# Patient Record
Sex: Female | Born: 1980
Health system: Southern US, Community
[De-identification: ages and names within clinical notes are randomized; demographics above are authoritative.]

## PROBLEM LIST (undated history)

## (undated) DIAGNOSIS — T7840XA Allergy, unspecified, initial encounter: Secondary | ICD-10-CM

## (undated) DIAGNOSIS — F329 Major depressive disorder, single episode, unspecified: Secondary | ICD-10-CM

## (undated) DIAGNOSIS — F32A Depression, unspecified: Secondary | ICD-10-CM

## (undated) DIAGNOSIS — F419 Anxiety disorder, unspecified: Secondary | ICD-10-CM

## (undated) HISTORY — DX: Allergy, unspecified, initial encounter: T78.40XA

## (undated) HISTORY — DX: Anxiety disorder, unspecified: F41.9

## (undated) HISTORY — DX: Depression, unspecified: F32.A

## (undated) HISTORY — DX: Major depressive disorder, single episode, unspecified: F32.9

## (undated) SURGERY — Surgical Case
Anesthesia: Regional

---
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
## 2000-05-27 HISTORY — PX: TOOTH EXTRACTION: SUR596

## 2012-12-31 ENCOUNTER — Telehealth (HOSPITAL_COMMUNITY): Payer: Self-pay | Admitting: Licensed Clinical Social Worker

## 2012-12-31 ENCOUNTER — Emergency Department (HOSPITAL_COMMUNITY)
Admission: EM | Admit: 2012-12-31 | Discharge: 2012-12-31 | Disposition: A | Discharge status: Home / Self Care | Payer: 59 | Attending: Emergency Medicine | Admitting: Emergency Medicine

## 2012-12-31 ENCOUNTER — Encounter (HOSPITAL_COMMUNITY): Payer: Self-pay | Admitting: *Deleted

## 2012-12-31 DIAGNOSIS — Z3202 Encounter for pregnancy test, result negative: Secondary | ICD-10-CM | POA: Insufficient documentation

## 2012-12-31 DIAGNOSIS — Z711 Person with feared health complaint in whom no diagnosis is made: Secondary | ICD-10-CM | POA: Insufficient documentation

## 2012-12-31 DIAGNOSIS — Z0000 Encounter for general adult medical examination without abnormal findings: Secondary | ICD-10-CM

## 2012-12-31 LAB — CBC WITH DIFFERENTIAL/PLATELET
Basophils Absolute: 0 10*3/uL (ref 0.0–0.1)
Basophils Relative: 0 % (ref 0–1)
Eosinophils Relative: 1 % (ref 0–5)
HCT: 35.2 % — ABNORMAL LOW (ref 36.0–46.0)
MCHC: 33.2 g/dL (ref 30.0–36.0)
MCV: 81.1 fL (ref 78.0–100.0)
Monocytes Absolute: 0.4 10*3/uL (ref 0.1–1.0)
Neutro Abs: 2.1 10*3/uL (ref 1.7–7.7)
Platelets: 200 10*3/uL (ref 150–400)
RDW: 13.2 % (ref 11.5–15.5)
WBC: 4.3 10*3/uL (ref 4.0–10.5)

## 2012-12-31 LAB — RAPID URINE DRUG SCREEN, HOSP PERFORMED
Amphetamines: NOT DETECTED
Barbiturates: NOT DETECTED
Benzodiazepines: NOT DETECTED
Cocaine: NOT DETECTED

## 2012-12-31 LAB — URINALYSIS, ROUTINE W REFLEX MICROSCOPIC
Bilirubin Urine: NEGATIVE
Ketones, ur: NEGATIVE mg/dL
Leukocytes, UA: NEGATIVE
Nitrite: NEGATIVE
Protein, ur: NEGATIVE mg/dL
Urobilinogen, UA: 0.2 mg/dL (ref 0.0–1.0)

## 2012-12-31 LAB — BASIC METABOLIC PANEL
Calcium: 9.7 mg/dL (ref 8.4–10.5)
Creatinine, Ser: 0.88 mg/dL (ref 0.50–1.10)
GFR calc Af Amer: >90 mL/min (ref >90)
Sodium: 139 mEq/L (ref 135–145)

## 2012-12-31 LAB — ETHANOL: Alcohol, Ethyl (B): <11 mg/dL (ref 0–11)
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
## 2012-12-31 NOTE — ED Notes (Signed)
MD at bedside. 

## 2012-12-31 NOTE — BH Assessment (Signed)
Assessment Note  Cassidy Short is an 32 y.o. female with no history of mental illness. She presents to [facility] transported via deputy. Pt also under IVC and papers were initiated by her spouse. IVC papers state that patient threatened suicide today and has a plan that her spouse  Is afraid she will go through with. He claims that patient has access to medications at her job. 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 IVC also sts that patient has cut herself. Pt admits that today she had a verbal argument with her spouse. Says that they have had conflict with each other x1 yr. Pt and spouse have tried marital counseling 1x over a yr ago but did not continue any further sessions. Today patient denies SI and contracts for safety. She denies HI and AVH's. She has not history of prior suicide attempts/gestures. No history of harm to others or AVH's. She has a great support system, which is her best friend at bedside. She also sts that she has a lot of other friends and family that are also supportive. Patient is open to individual therapy if needed. Pt denies depression/anxiety or history. Her affect and mood is appropriate. She reports stress related to marital issues only and not other issues noted. She is cooperative, alert, pleasant mood. Her judgement and insight appear normal. Pt's appetite/sleep are also normal. She denies drug use (UDS is negative). She drinks alcoholic beverages socially.  Pt ran by Verne Spurr, PA for dis positioning. Lloyd Huger consulted with Dr. Lucianne Muss who requested Lloyd Huger to complete a tele psych. Lloyd Huger will evaluate patient for further dis positoning.    Axis I: Mood Disorder NOS Axis II: Deferred Axis III: No past medical history on file. Axis IV: Marital Conflict Axis V: 51-60 moderate symptoms  Past Medical History: No past medical history on file.  Past Surgical History  Procedure Laterality Date  . Hernia repair      Family History: No family history on  file.  Social History:  reports that she has never smoked. She does not have any smokeless tobacco history on file. She reports that  drinks alcohol. She reports that she does not use illicit drugs.  Additional Social History:  Alcohol / Drug Use Pain Medications: SEE MAR Prescriptions: SEE MAR Over the Counter: SEE MAR History of alcohol / drug use?: Yes Substance #1 Name of Substance 1: Alcohol  1 - Age of First Use: teens 1 - Amount (size/oz): n/a 1 - Frequency: social drinker only  1 - Duration: n/a 1 - Last Use / Amount: "2-3 weekends ago"  CIWA:   COWS:    Allergies: No Known Allergies  Home Medications:  (Not in a hospital admission)  OB/GYN Status:  Patient's last menstrual period was 12/08/2012.  General Assessment Data Location of Assessment: [facility] ED Is this a Tele or Face-to-Face Assessment?: Tele Assessment Is this an Initial Assessment or a Re-assessment for this encounter?: Initial Assessment Living Arrangements: Other (Comment);Spouse/significant other (pt lives with her spouse) Can pt return to current living arrangement?: Yes Admission Status: Voluntary Is patient capable of signing voluntary admission?: Yes Transfer from: Acute Hospital Referral Source: Self/Family/Friend     Risk to self Suicidal Ideation: No (pt denies; however; spouse sts pt is suicidal on IVC papers) Suicidal Intent: No Is patient at risk for suicide?: No Suicidal Plan?: No Access to Means: No What has been your use of drugs/alcohol within the last 12 months?:  (pt denies alcohol and drug abuse;  social drinker only) Previous Attempts/Gestures:  (pt denies; spouse reports pt cut herself on IVC papers) How many times?:  (0 per patient) Other Self Harm Risks:  (pt reports pt cut herself; pt denies) Triggers for Past Attempts: Other (Comment) (no previous attempts/gestures; per patient) Intentional Self Injurious Behavior: None Family Suicide History:  ("My sister has mental illness  but not sure with what") Recent stressful life event(s): Other (Comment);Turmoil (Comment);Conflict (Comment) (marital problems; "big argument with spouse today") Persecutory voices/beliefs?: No Depression: Yes Depression Symptoms: Feeling angry/irritable;Feeling worthless/self pity;Loss of interest in usual pleasures;Isolating Substance abuse history and/or treatment for substance abuse?: No Suicide prevention information given to non-admitted patients: Not applicable  Risk to Others Homicidal Ideation: No Thoughts of Harm to Others: No Current Homicidal Intent: No Current Homicidal Plan: No Access to Homicidal Means: No Identified Victim:  (n/a) History of harm to others?: No Assessment of Violence: None Noted Violent Behavior Description:  (patient calm and cooperative) Does patient have access to weapons?: No Criminal Charges Pending?: No Does patient have a court date: No  Psychosis Hallucinations: None noted Delusions: None noted  Mental Status Report Appear/Hygiene: Other (Comment) (appropriate) Eye Contact: Fair Motor Activity: Freedom of movement Speech: Logical/coherent Level of Consciousness: Alert Mood: Depressed Affect: Appropriate to circumstance Anxiety Level: None Thought Processes: Coherent Judgement: Unimpaired Orientation: Person;Place;Time;Situation Obsessive Compulsive Thoughts/Behaviors: None  Cognitive Functioning Concentration: Decreased Memory: Remote Intact;Recent Intact IQ: Average Insight: Good Impulse Control: Good Appetite: Good Weight Loss:  (none reported) Weight Gain:  (none reported) Sleep: No Change Total Hours of Sleep:  (6-8 hours per night) Vegetative Symptoms: None  ADLScreening [facility] Assessment Services) Patient's cognitive ability adequate to safely complete daily activities?: Yes Patient able to express need for assistance with ADLs?: Yes Independently performs ADLs?: Yes (appropriate for developmental age)  Prior  Inpatient Therapy Prior Inpatient Therapy: No Prior Therapy Dates:  (n/a) Prior Therapy Facilty/Provider(s):  (n/a) Reason for Treatment:  (n/a)  Prior Outpatient Therapy Prior Outpatient Therapy: Yes Prior Therapy Dates:  (over 1 yr ago) Prior Therapy Facilty/Provider(s):  (Marital Counselor "Linda") Reason for Treatment:  (depression)  ADL Screening (condition at time of admission) Patient's cognitive ability adequate to safely complete daily activities?: Yes Is the patient deaf or have difficulty hearing?: No Does the patient have difficulty seeing, even when wearing glasses/contacts?: No Does the patient have difficulty concentrating, remembering, or making decisions?: No Patient able to express need for assistance with ADLs?: Yes Does the patient have difficulty dressing or bathing?: No Independently performs ADLs?: Yes (appropriate for developmental age) Communication: Independent Dressing (OT): Independent Grooming: Independent Feeding: Independent Bathing: Independent Toileting: Independent In/Out Bed: Independent Walks in Home: Independent Does the patient have difficulty walking or climbing stairs?: No Weakness of Legs: None Weakness of Arms/Hands: None  Home Assistive Devices/Equipment Home Assistive Devices/Equipment: None    Abuse/Neglect Assessment (Assessment to be complete while patient is alone) Physical Abuse: Denies Verbal Abuse: Denies Sexual Abuse: Denies Exploitation of patient/patient's resources: Denies Self-Neglect: Denies Values / Beliefs Cultural Requests During Hospitalization: None Spiritual Requests During Hospitalization: None   Advance Directives (For Healthcare) Advance Directive: Patient does not have advance directive Nutrition Screen- [facility] Adult/[facility] Patient's home diet: Regular  Additional Information 1:1 In Past 12 Months?: Yes CIRT Risk: No Elopement Risk: No Does patient have medical clearance?: Yes     Disposition:   Disposition Initial Assessment Completed for this Encounter: Yes Disposition of Patient: Other dispositions (Pending evaluation by provider Antoine Poche, PA)) Other disposition(s):  (Dispositoin pending a  telepsych consult)  On Site Evaluation by:   Reviewed with Physician:    Melynda Ripple [facility] 12/31/2012 2:22 PM

## 2012-12-31 NOTE — ED Notes (Signed)
Pt changing into paper scrubs, security called to wand pt, [employer] at bedside
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
## 2013-11-08 DIAGNOSIS — T7840XA Allergy, unspecified, initial encounter: Secondary | ICD-10-CM | POA: Insufficient documentation

## 2013-11-08 DIAGNOSIS — F419 Anxiety disorder, unspecified: Secondary | ICD-10-CM | POA: Insufficient documentation

## 2013-11-08 DIAGNOSIS — F325 Major depressive disorder, single episode, in full remission: Secondary | ICD-10-CM | POA: Insufficient documentation

## 2013-11-09 ENCOUNTER — Encounter: Payer: Self-pay | Admitting: Physician Assistant

## 2013-11-09 ENCOUNTER — Ambulatory Visit (INDEPENDENT_AMBULATORY_CARE_PROVIDER_SITE_OTHER): Payer: 59 | Admitting: Physician Assistant

## 2013-11-09 VITALS — BP 110/62 | HR 72 | Temp 98.5°F | Resp 16 | Ht 64.0 in | Wt 143.0 lb

## 2013-11-09 DIAGNOSIS — F329 Major depressive disorder, single episode, unspecified: Secondary | ICD-10-CM

## 2013-11-09 DIAGNOSIS — Z0000 Encounter for general adult medical examination without abnormal findings: Secondary | ICD-10-CM

## 2013-11-09 DIAGNOSIS — F32A Depression, unspecified: Secondary | ICD-10-CM

## 2013-11-09 DIAGNOSIS — F411 Generalized anxiety disorder: Secondary | ICD-10-CM

## 2013-11-09 LAB — CBC WITH DIFFERENTIAL/PLATELET
BASOS ABS: 0.1 10*3/uL (ref 0.0–0.1)
Basophils Relative: 1 % (ref 0–1)
Eosinophils Absolute: 0.1 10*3/uL (ref 0.0–0.7)
Eosinophils Relative: 1 % (ref 0–5)
HEMATOCRIT: 34.7 % — AB (ref 36.0–46.0)
HEMOGLOBIN: 11.9 g/dL — AB (ref 12.0–15.0)
LYMPHS PCT: 31 % (ref 12–46)
Lymphs Abs: 1.7 10*3/uL (ref 0.7–4.0)
MCH: 26.4 pg (ref 26.0–34.0)
MCHC: 34.3 g/dL (ref 30.0–36.0)
MCV: 77.1 fL — ABNORMAL LOW (ref 78.0–100.0)
MONO ABS: 0.3 10*3/uL (ref 0.1–1.0)
MONOS PCT: 6 % (ref 3–12)
NEUTROS ABS: 3.3 10*3/uL (ref 1.7–7.7)
NEUTROS PCT: 61 % (ref 43–77)
Platelets: 225 10*3/uL (ref 150–400)
RBC: 4.5 MIL/uL (ref 3.87–5.11)
RDW: 15.2 % (ref 11.5–15.5)
WBC: 5.4 10*3/uL (ref 4.0–10.5)

## 2013-11-09 MED ORDER — ALPRAZOLAM 0.5 MG PO TABS: ORAL_TABLET | ORAL | Status: DC

## 2013-11-09 MED ORDER — DROSPIRENONE-ETHINYL ESTRADIOL 3-0.02 MG PO TABS: 1.0000 | ORAL_TABLET | Freq: Every day | ORAL | Status: DC

## 2013-11-09 MED ORDER — BUPROPION HCL ER (XL) 150 MG PO TB24: 150.0000 mg | ORAL_TABLET | ORAL | Status: DC
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
## 2013-11-09 NOTE — Patient Instructions (Signed)

## 2013-11-10 LAB — LIPID PANEL
Cholesterol: 138 mg/dL (ref 0–200)
HDL: 59 mg/dL (ref >39)
LDL CALC: 50 mg/dL (ref 0–99)
TRIGLYCERIDES: 147 mg/dL (ref <150)
Total CHOL/HDL Ratio: 2.3 Ratio
VLDL: 29 mg/dL (ref 0–40)

## 2013-11-10 LAB — URINALYSIS, ROUTINE W REFLEX MICROSCOPIC
BILIRUBIN URINE: NEGATIVE
GLUCOSE, UA: NEGATIVE mg/dL
Hgb urine dipstick: NEGATIVE
KETONES UR: NEGATIVE mg/dL
Leukocytes, UA: NEGATIVE
Nitrite: NEGATIVE
PH: 5.5 (ref 5.0–8.0)
Protein, ur: NEGATIVE mg/dL
SPECIFIC GRAVITY, URINE: 1.029 (ref 1.005–1.030)
UROBILINOGEN UA: 0.2 mg/dL (ref 0.0–1.0)

## 2013-11-10 LAB — BASIC METABOLIC PANEL WITH GFR
BUN: 16 mg/dL (ref 6–23)
CHLORIDE: 103 meq/L (ref 96–112)
CO2: 27 mEq/L (ref 19–32)
Calcium: 9.3 mg/dL (ref 8.4–10.5)
Creat: 0.81 mg/dL (ref 0.50–1.10)
GFR, Est African American: >89 mL/min
GLUCOSE: 80 mg/dL (ref 70–99)
POTASSIUM: 4.1 meq/L (ref 3.5–5.3)
SODIUM: 138 meq/L (ref 135–145)

## 2013-11-10 LAB — MAGNESIUM: MAGNESIUM: 1.9 mg/dL (ref 1.5–2.5)

## 2013-11-10 LAB — HEPATIC FUNCTION PANEL
ALK PHOS: 40 U/L (ref 39–117)
ALT: 12 U/L (ref 0–35)
AST: 18 U/L (ref 0–37)
Albumin: 4.3 g/dL (ref 3.5–5.2)
BILIRUBIN DIRECT: 0.1 mg/dL (ref 0.0–0.3)
BILIRUBIN INDIRECT: 0.3 mg/dL (ref 0.2–1.2)
TOTAL PROTEIN: 6.6 g/dL (ref 6.0–8.3)
Total Bilirubin: 0.4 mg/dL (ref 0.2–1.2)

## 2013-11-10 LAB — IRON AND TIBC
%SAT: 7 % — AB (ref 20–55)
IRON: 30 ug/dL — AB (ref 42–145)
TIBC: 409 ug/dL (ref 250–470)
UIBC: 379 ug/dL (ref 125–400)

## 2013-11-10 LAB — FERRITIN: Ferritin: 6 ng/mL — ABNORMAL LOW (ref 10–291)

## 2013-11-10 LAB — INSULIN, FASTING: INSULIN FASTING, SERUM: 39 u[IU]/mL — AB (ref 3–28)

## 2013-11-10 LAB — HEMOGLOBIN A1C
Hgb A1c MFr Bld: 5.7 % — ABNORMAL HIGH (ref <5.7)
Mean Plasma Glucose: 117 mg/dL — ABNORMAL HIGH (ref <117)

## 2013-11-10 LAB — MICROALBUMIN / CREATININE URINE RATIO
CREATININE, URINE: 205.2 mg/dL
MICROALB UR: 0.75 mg/dL (ref 0.00–1.89)
MICROALB/CREAT RATIO: 3.7 mg/g (ref 0.0–30.0)

## 2013-11-10 LAB — VITAMIN D 25 HYDROXY (VIT D DEFICIENCY, FRACTURES): Vit D, 25-Hydroxy: 36 ng/mL (ref 30–89)

## 2013-11-10 LAB — TSH: TSH: 3.015 u[IU]/mL (ref 0.350–4.500)

## 2013-11-10 LAB — VITAMIN B12: VITAMIN B 12: 326 pg/mL (ref 211–911)

## 2013-12-15 ENCOUNTER — Other Ambulatory Visit (HOSPITAL_COMMUNITY)
Admission: RE | Admit: 2013-12-15 | Discharge: 2013-12-15 | Disposition: A | Discharge status: Home / Self Care | Payer: 59 | Source: Ambulatory Visit | Attending: Physician Assistant | Admitting: Physician Assistant

## 2013-12-15 ENCOUNTER — Encounter: Payer: Self-pay | Admitting: Physician Assistant

## 2013-12-15 ENCOUNTER — Ambulatory Visit (INDEPENDENT_AMBULATORY_CARE_PROVIDER_SITE_OTHER): Payer: 59 | Admitting: Physician Assistant

## 2013-12-15 VITALS — BP 100/60 | HR 60 | Temp 97.9°F | Resp 16 | Wt 139.0 lb

## 2013-12-15 DIAGNOSIS — F411 Generalized anxiety disorder: Secondary | ICD-10-CM

## 2013-12-15 DIAGNOSIS — F3289 Other specified depressive episodes: Secondary | ICD-10-CM

## 2013-12-15 DIAGNOSIS — F329 Major depressive disorder, single episode, unspecified: Secondary | ICD-10-CM

## 2013-12-15 DIAGNOSIS — F32A Depression, unspecified: Secondary | ICD-10-CM

## 2013-12-15 DIAGNOSIS — Z124 Encounter for screening for malignant neoplasm of cervix: Secondary | ICD-10-CM

## 2013-12-15 DIAGNOSIS — Z1151 Encounter for screening for human papillomavirus (HPV): Secondary | ICD-10-CM | POA: Insufficient documentation

## 2013-12-15 DIAGNOSIS — Z01419 Encounter for gynecological examination (general) (routine) without abnormal findings: Secondary | ICD-10-CM | POA: Insufficient documentation

## 2013-12-15 MED ORDER — LEVONORGEST-ETH ESTRAD 91-DAY 0.15-0.03 MG PO TABS: 1.0000 | ORAL_TABLET | Freq: Every day | ORAL | Status: DC

## 2013-12-15 NOTE — Progress Notes (Signed)
   Subjective:    Patient ID: Cassidy Short, female    DOB: 11/16/1980, 33 y.o.   MRN: [id number]  HPI Patient presents for 1 month follow up for PAP. She was also put on BCP, yaz, but she has been having spotting and is spotting today. She was put on wellbutrin and states that she has less "down days" and she would like to continue it. She has not tried the xanax yet but has it if needed.   Review of Systems  All other systems reviewed and are negative.      Objective:   Physical Exam  Constitutional: She is oriented to person, place, and time.  Cardiovascular: Normal rate and regular rhythm.   Pulmonary/Chest: Effort normal and breath sounds normal.  Abdominal: Soft. Bowel sounds are normal.  Genitourinary: Vagina normal and uterus normal. There is no rash or lesion on the right labia. There is no rash or lesion on the left labia. Cervix exhibits discharge (some minimal blood). Cervix exhibits no motion tenderness and no friability. Right adnexum displays no mass. Left adnexum displays no mass. No erythema or tenderness around the vagina. No vaginal discharge found.  Pap sent off  Neurological: She is alert and oriented to person, place, and time.  Skin: Skin is warm and dry.      Assessment & Plan:  BCP- would like to try a 3 month pill, will prescribe.  Anxiety/depression- continue wellbutrin XL Pap sent off

## 2013-12-15 NOTE — Patient Instructions (Signed)
B12 is low end of normal, add sublingual B12. The sublingual is better than the pills because likely you are not absorbing in your intestines well so the sublingual gets absorbed through your mouth and ensures you get the amount you need. Will help with energy, memory/concentration, decrease nerve pain, and help with weight loss. B12 is water soluble vitamin so you can not over dose on it, and anything you do not use will be sent out in your urine.    Contraception Choices Birth control (contraception) is the use of any methods or devices to stop pregnancy from happening. Below are some methods to help avoid pregnancy. HORMONAL BIRTH CONTROL  A small tube put under the skin of the upper arm (implant). The tube can stay in place for 3 years. The implant must be taken out after 3 years.  Shots given every 3 months.  Pills taken every day.  Patches that are changed once a week.  A ring put into the vagina (vaginal ring). The ring is left in place for 3 weeks and removed for 1 week. Then, a new ring is put in the vagina.  Emergency birth control pills taken after unprotected sex (intercourse). BARRIER BIRTH CONTROL   A thin covering worn on the penis (female condom) during sex.  A soft, loose covering put into the vagina (female condom) before sex.  A rubber bowl that sits over the cervix (diaphragm). The bowl must be made for you. The bowl is put into the vagina before sex. The bowl is left in place for 6 to 8 hours after sex.  A small, soft cup that fits over the cervix (cervical cap). The cup must be made for you. The cup can be left in place for 48 hours after sex.  A sponge that is put into the vagina before sex.  A chemical that kills or stops sperm from getting into the cervix and uterus (spermicide). The chemical may be a cream, jelly, foam, or pill. INTRAUTERINE (IUD) BIRTH CONTROL   IUD birth control is a small, T-shaped piece of plastic. The plastic is put inside the uterus.  There are 2 types of IUD:  Copper IUD. The IUD is covered in copper wire. The copper makes a fluid that kills sperm. It can stay in place for 10 years.  Hormone IUD. The hormone stops pregnancy from happening. It can stay in place for 5 years. PERMANENT METHODS  When the woman has her fallopian tubes sealed, tied, or blocked during surgery. This stops the egg from traveling to the uterus.  The doctor places a small coil or insert into each fallopian tube. This causes scar tissue to form and blocks the fallopian tubes.  When the female has the tubes that carry sperm tied off (vasectomy). NATURAL FAMILY PLANNING BIRTH CONTROL   Natural family planning means not having sex or using barrier birth control on the days the woman could become pregnant.  Use a calendar to keep track of the length of each period and know the days she can get pregnant.  Avoid sex during ovulation.  Use a thermometer to measure body temperature. Also watch for symptoms of ovulation.  Time sex to be after the woman has ovulated. Use condoms to help protect yourself against sexually transmitted infections (STIs). Do this no matter what type of birth control you use. Talk to your doctor about which type of birth control is best for you. Document Released: 03/10/2009 Document Revised: 05/18/2013 Document Reviewed: 12/02/2012 [employer] Patient  Information 2015 [employer]. This information is not intended to replace advice given to you by your health care provider. Make sure you discuss any questions you have with your health care provider.  

## 2013-12-17 LAB — CYTOLOGY - PAP

## 2013-12-28 ENCOUNTER — Telehealth: Payer: Self-pay | Admitting: Internal Medicine

## 2013-12-28 NOTE — Telephone Encounter (Signed)
PATIENT CALLED AND INFORMED US THAT THE PHARM SAID RX FOR  levonorgestrel-ethinyl estradiol (JOLESSA) 0.15-0.03 MG tablet [[id number]][[id number]] HAS TO BE CALLED IN AGAIN.  Thank you, Katrina Webb SilversmithWelch [facility] [phone] Fax [phone]

## 2013-12-29 MED ORDER — LEVONORGEST-ETH ESTRAD 91-DAY 0.15-0.03 MG PO TABS: 1.0000 | ORAL_TABLET | Freq: Every day | ORAL | Status: DC

## 2014-02-10 ENCOUNTER — Other Ambulatory Visit: Payer: Self-pay | Admitting: Physician Assistant

## 2014-05-10 ENCOUNTER — Other Ambulatory Visit: Payer: Self-pay | Admitting: Internal Medicine

## 2014-06-13 ENCOUNTER — Other Ambulatory Visit: Payer: Self-pay | Admitting: Physician Assistant

## 2014-11-10 ENCOUNTER — Encounter: Payer: Self-pay | Admitting: Physician Assistant

## 2014-11-17 ENCOUNTER — Encounter: Payer: Self-pay | Admitting: Physician Assistant

## 2014-11-17 ENCOUNTER — Ambulatory Visit (INDEPENDENT_AMBULATORY_CARE_PROVIDER_SITE_OTHER): Payer: 59 | Admitting: Physician Assistant

## 2014-11-17 VITALS — BP 102/68 | HR 56 | Temp 97.7°F | Resp 16 | Ht 64.0 in | Wt 143.0 lb

## 2014-11-17 DIAGNOSIS — R5383 Other fatigue: Secondary | ICD-10-CM

## 2014-11-17 DIAGNOSIS — R6889 Other general symptoms and signs: Secondary | ICD-10-CM

## 2014-11-17 DIAGNOSIS — T7840XD Allergy, unspecified, subsequent encounter: Secondary | ICD-10-CM

## 2014-11-17 DIAGNOSIS — H00013 Hordeolum externum right eye, unspecified eyelid: Secondary | ICD-10-CM

## 2014-11-17 DIAGNOSIS — F32A Depression, unspecified: Secondary | ICD-10-CM

## 2014-11-17 DIAGNOSIS — F329 Major depressive disorder, single episode, unspecified: Secondary | ICD-10-CM

## 2014-11-17 DIAGNOSIS — Z0001 Encounter for general adult medical examination with abnormal findings: Secondary | ICD-10-CM

## 2014-11-17 DIAGNOSIS — E538 Deficiency of other specified B group vitamins: Secondary | ICD-10-CM | POA: Insufficient documentation

## 2014-11-17 DIAGNOSIS — E559 Vitamin D deficiency, unspecified: Secondary | ICD-10-CM | POA: Insufficient documentation

## 2014-11-17 DIAGNOSIS — F419 Anxiety disorder, unspecified: Secondary | ICD-10-CM

## 2014-11-17 DIAGNOSIS — Z0000 Encounter for general adult medical examination without abnormal findings: Secondary | ICD-10-CM

## 2014-11-17 LAB — CBC WITH DIFFERENTIAL/PLATELET
BASOS ABS: 0 10*3/uL (ref 0.0–0.1)
BASOS PCT: 0 % (ref 0–1)
Eosinophils Absolute: 0.1 10*3/uL (ref 0.0–0.7)
Eosinophils Relative: 2 % (ref 0–5)
HEMATOCRIT: 39.7 % (ref 36.0–46.0)
Hemoglobin: 13.3 g/dL (ref 12.0–15.0)
LYMPHS PCT: 23 % (ref 12–46)
Lymphs Abs: 1.7 10*3/uL (ref 0.7–4.0)
MCH: 27.6 pg (ref 26.0–34.0)
MCHC: 33.5 g/dL (ref 30.0–36.0)
MCV: 82.4 fL (ref 78.0–100.0)
MPV: 8.6 fL (ref 8.6–12.4)
Monocytes Absolute: 0.4 10*3/uL (ref 0.1–1.0)
Monocytes Relative: 5 % (ref 3–12)
NEUTROS ABS: 5.1 10*3/uL (ref 1.7–7.7)
Neutrophils Relative %: 70 % (ref 43–77)
PLATELETS: 211 10*3/uL (ref 150–400)
RBC: 4.82 MIL/uL (ref 3.87–5.11)
RDW: 13.6 % (ref 11.5–15.5)
WBC: 7.3 10*3/uL (ref 4.0–10.5)

## 2014-11-17 MED ORDER — ERYTHROMYCIN 5 MG/GM OP OINT: TOPICAL_OINTMENT | OPHTHALMIC | Status: DC

## 2014-11-17 MED ORDER — DOXYCYCLINE HYCLATE 100 MG PO TABS: 100.0000 mg | ORAL_TABLET | Freq: Two times a day (BID) | ORAL | Status: DC

## 2014-11-17 MED ORDER — PREDNISONE 20 MG PO TABS: ORAL_TABLET | ORAL | Status: DC

## 2014-11-17 NOTE — Progress Notes (Signed)
Complete Physical  Assessment and Plan: 1. Depression remission  2. Anxiety remission  3. Allergy, subsequent encounter Take OTC allergy pill  4. B12 deficiency Check labs, ? Need injection - Vitamin B12 - Methylmalonic acid, serum  5. Vitamin D deficiency - Vit D  25 hydroxy (rtn osteoporosis monitoring)  6. Other fatigue Discussed that we will try to treat other things first, does not need ADD medication if does not have ADD. Increase sleep, eat better, will check labs.  If all negative may need to retry wellbutrin.  - CBC with Differential/Platelet - BASIC METABOLIC PANEL WITH GFR - Hepatic function panel - Lipid panel - TSH - Magnesium - Urinalysis, Routine w reflex microscopic (not at [facility]) - Iron and TIBC - Ferritin  7. Routine general medical examination at a health care facility Pap due 2018, TDAP next year  8. Hordeolum externum, right Get appointment with eye doctor, no evidence of cellulitis at this time.  Go to ER if changes in vision, red/painful eye.  - predniSONE (DELTASONE) 20 MG tablet; 2 tablets daily for 3 days, 1 tablet daily for 4 days.  Dispense: 10 tablet; Refill: 0 - doxycycline (VIBRA-TABS) 100 MG tablet; Take 1 tablet (100 mg total) by mouth 2 (two) times daily.  Dispense: 20 tablet; Refill: 0 - erythromycin ophthalmic ointment; Apply 1 cm ribbon in affected eye(s) up to 6 times daily for 7 days.  Dispense quantity sufficient.  Dispense: 3.5 g; Refill: 0  Discussed med's effects and SE's. Screening labs and tests as requested with regular follow-up as recommended.  HPI 34 y.o. female  presents for a complete physical. Her blood pressure has been controlled at home, today their BP is BP: 102/68 mmHg She does workout and has moved to [place]. She denies chest pain, shortness of breath, dizziness.  She works as a Radiation protection practitioner, her and her husband have recently divorced, she has not seen him since Jan.  She is off wellbutrin and very  rarely takes xanax, she is not seeing a Veterinary surgeon but feels she is doing well.  She has started school and states she has been very tired with school and work, she is requesting an ADD medication to help with fatigue but she has never had a problem in the past with ADD, can complete tasks.  She has had 3 days of right lower lid swelling, has history of stys in the past but this one has gotten infected, + swelling, crusting, painful swollen lower lid without fever, chills, without photosensitivity/blurred vision, HA, nausea.    Current Medications:  Current Outpatient Prescriptions on File Prior to Visit  Medication Sig Dispense Refill  . ALPRAZolam (XANAX) 0.5 MG tablet 1/2-1 PRN TID for anxiety 30 tablet 0  . buPROPion (WELLBUTRIN XL) 150 MG 24 hr tablet TAKE 1 TABLET BY MOUTH EVERY MORNING 30 tablet 3  . levonorgestrel-ethinyl estradiol (JOLESSA) 0.15-0.03 MG tablet Take 1 tablet by mouth daily. 1 Package 4   No current facility-administered medications on file prior to visit.   Health Maintenance:   Tetanus:  2007, due next year  Pneumovax: N/A Flu vaccine: 2014 Zostavax: N/A Pap: 2015, never abnormal pap, due 3 years MGM: N/A DEXA: Colonoscopy: EGD: Menses: Current, regular  Allergies: No Known Allergies Medical History:  Past Medical History  Diagnosis Date  . Depression   . Allergy   . Anxiety    Surgical History:  Past Surgical History  Procedure Laterality Date  . Hernia repair  1986  .  Tooth extraction  2002   Family History:  Family History  Problem Relation Age of Onset  . Hypertension Mother    Social History:  History  Substance Use Topics  . Smoking status: Never Smoker   . Smokeless tobacco: Never Used  . Alcohol Use: Yes     Comment: seldom   Review of Systems  Constitutional: Negative.   HENT: Negative.   Eyes: Positive for pain, discharge and redness. Negative for blurred vision, double vision and photophobia.  Respiratory: Negative.    Cardiovascular: Negative.   Gastrointestinal: Negative.   Genitourinary: Negative.   Musculoskeletal: Negative.   Skin: Negative.   Neurological: Negative.   Endo/Heme/Allergies: Negative.   Psychiatric/Behavioral: Negative.      Physical Exam: Estimated body mass index is 24.53 kg/(m^2) as calculated from the following:   Height as of this encounter: 5\' 4"  (1.626 m).   Weight as of this encounter: 143 lb (64.864 kg). BP 102/68 mmHg  Pulse 56  Temp(Src) 97.7 F (36.5 C)  Resp 16  Ht 5\' 4"  (1.626 m)  Wt 143 lb (64.864 kg)  BMI 24.53 kg/m2  LMP 10/23/2014 (Approximate) General Appearance: Well nourished, in no apparent distress. Eyes: PERRLA, EOMs, right lower inner lid with erythema, swelling, crusting, tenderness to palpation, no surrounding erythema. normal fundi and vessels. Sinuses: No Frontal/maxillary tenderness ENT/Mouth: Ext aud canals clear, normal light reflex with TMs without erythema, bulging.  Good dentition. No erythema, swelling, or exudate on post pharynx. Tonsils not swollen or erythematous. Hearing normal.  Neck: Supple, thyroid normal. No bruits Respiratory: Respiratory effort normal, BS equal bilaterally without rales, rhonchi, wheezing or stridor. Cardio: RRR without murmurs, rubs or gallops. Brisk peripheral pulses without edema.  Chest: symmetric, with normal excursions and percussion. Breasts: Symmetric, without lumps, nipple discharge, retractions. Abdomen: Soft, +BS. Non tender, no guarding, rebound, hernias, masses, or organomegaly. .  Lymphatics: Non tender without lymphadenopathy.  Genitourinary: defer  Musculoskeletal: Full ROM all peripheral extremities,5/5 strength, and normal gait. Skin: Warm, dry without rashes, lesions, ecchymosis.  Neuro: Cranial nerves intact, reflexes equal bilaterally. Normal muscle tone, no cerebellar symptoms. Sensation intact.  Psych: Awake and oriented X 3, normal affect, Insight and Judgment appropriate.     Quentin Mulling 9:53 AM

## 2014-11-17 NOTE — Patient Instructions (Signed)
Preventive Care for Adults A healthy lifestyle and preventive care can promote health and wellness. Preventive health guidelines for women include the following key practices.  A routine yearly physical is a good way to check with your health care provider about your health and preventive screening. It is a chance to share any concerns and updates on your health and to receive a thorough exam.  Visit your dentist for a routine exam and preventive care every 6 months. Brush your teeth twice a day and floss once a day. Good oral hygiene prevents tooth decay and gum disease.  The frequency of eye exams is based on your age, health, family medical history, use of contact lenses, and other factors. Follow your health care provider's recommendations for frequency of eye exams.  Eat a healthy diet. Foods like vegetables, fruits, whole grains, low-fat dairy products, and lean protein foods contain the nutrients you need without too many calories. Decrease your intake of foods high in solid fats, added sugars, and salt. Eat the right amount of calories for you.Get information about a proper diet from your health care provider, if necessary.  Regular physical exercise is one of the most important things you can do for your health. Most adults should get at least 150 minutes of moderate-intensity exercise (any activity that increases your heart rate and causes you to sweat) each week. In addition, most adults need muscle-strengthening exercises on 2 or more days a week.  Maintain a healthy weight. The body mass index (BMI) is a screening tool to identify possible weight problems. It provides an estimate of body fat based on height and weight. Your health care provider can find your BMI and can help you achieve or maintain a healthy weight.For adults 20 years and older:  A BMI below 18.5 is considered underweight.  A BMI of 18.5 to 24.9 is normal.  A BMI of 25 to 29.9 is considered overweight.  A BMI of  30 and above is considered obese.  Maintain normal blood lipids and cholesterol levels by exercising and minimizing your intake of saturated fat. Eat a balanced diet with plenty of fruit and vegetables. Blood tests for lipids and cholesterol should begin at age 76 and be repeated every 5 years. If your lipid or cholesterol levels are high, you are over 50, or you are at high risk for heart disease, you may need your cholesterol levels checked more frequently.Ongoing high lipid and cholesterol levels should be treated with medicines if diet and exercise are not working.  If you smoke, find out from your health care provider how to quit. If you do not use tobacco, do not start.  Lung cancer screening is recommended for adults aged 22-80 years who are at high risk for developing lung cancer because of a history of smoking. A yearly low-dose CT scan of the lungs is recommended for people who have at least a 30-pack-year history of smoking and are a current smoker or have quit within the past 15 years. A pack year of smoking is smoking an average of 1 pack of cigarettes a day for 1 year (for example: 1 pack a day for 30 years or 2 packs a day for 15 years). Yearly screening should continue until the smoker has stopped smoking for at least 15 years. Yearly screening should be stopped for people who develop a health problem that would prevent them from having lung cancer treatment.  If you are pregnant, do not drink alcohol. If you are breastfeeding,  be very cautious about drinking alcohol. If you are not pregnant and choose to drink alcohol, do not have more than 1 drink per day. One drink is considered to be 12 ounces (355 mL) of beer, 5 ounces (148 mL) of wine, or 1.5 ounces (44 mL) of liquor.  Avoid use of street drugs. Do not share needles with anyone. Ask for help if you need support or instructions about stopping the use of drugs.  High blood pressure causes heart disease and increases the risk of  stroke. Your blood pressure should be checked at least every 1 to 2 years. Ongoing high blood pressure should be treated with medicines if weight loss and exercise do not work.  If you are 3-86 years old, ask your health care provider if you should take aspirin to prevent strokes.  Diabetes screening involves taking a blood sample to check your fasting blood sugar level. This should be done once every 3 years, after age 67, if you are within normal weight and without risk factors for diabetes. Testing should be considered at a younger age or be carried out more frequently if you are overweight and have at least 1 risk factor for diabetes.  Breast cancer screening is essential preventive care for women. You should practice "breast self-awareness." This means understanding the normal appearance and feel of your breasts and may include breast self-examination. Any changes detected, no matter how small, should be reported to a health care provider. Women in their 8s and 30s should have a clinical breast exam (CBE) by a health care provider as part of a regular health exam every 1 to 3 years. After age 70, women should have a CBE every year. Starting at age 25, women should consider having a mammogram (breast X-ray test) every year. Women who have a family history of breast cancer should talk to their health care provider about genetic screening. Women at a high risk of breast cancer should talk to their health care providers about having an MRI and a mammogram every year.  Breast cancer gene (BRCA)-related cancer risk assessment is recommended for women who have family members with BRCA-related cancers. BRCA-related cancers include breast, ovarian, tubal, and peritoneal cancers. Having family members with these cancers may be associated with an increased risk for harmful changes (mutations) in the breast cancer genes BRCA1 and BRCA2. Results of the assessment will determine the need for genetic counseling and  BRCA1 and BRCA2 testing.  Routine pelvic exams to screen for cancer are no longer recommended for nonpregnant women who are considered low risk for cancer of the pelvic organs (ovaries, uterus, and vagina) and who do not have symptoms. Ask your health care provider if a screening pelvic exam is right for you.  If you have had past treatment for cervical cancer or a condition that could lead to cancer, you need Pap tests and screening for cancer for at least 20 years after your treatment. If Pap tests have been discontinued, your risk factors (such as having a new sexual partner) need to be reassessed to determine if screening should be resumed. Some women have medical problems that increase the chance of getting cervical cancer. In these cases, your health care provider may recommend more frequent screening and Pap tests.  The HPV test is an additional test that may be used for cervical cancer screening. The HPV test looks for the virus that can cause the cell changes on the cervix. The cells collected during the Pap test can be  tested for HPV. The HPV test could be used to screen women aged 30 years and older, and should be used in women of any age who have unclear Pap test results. After the age of 30, women should have HPV testing at the same frequency as a Pap test.  Colorectal cancer can be detected and often prevented. Most routine colorectal cancer screening begins at the age of 50 years and continues through age 75 years. However, your health care provider may recommend screening at an earlier age if you have risk factors for colon cancer. On a yearly basis, your health care provider may provide home test kits to check for hidden blood in the stool. Use of a small camera at the end of a tube, to directly examine the colon (sigmoidoscopy or colonoscopy), can detect the earliest forms of colorectal cancer. Talk to your health care provider about this at age 50, when routine screening begins. Direct  exam of the colon should be repeated every 5-10 years through age 75 years, unless early forms of pre-cancerous polyps or small growths are found.  People who are at an increased risk for hepatitis B should be screened for this virus. You are considered at high risk for hepatitis B if:  You were born in a country where hepatitis B occurs often. Talk with your health care provider about which countries are considered high risk.  Your parents were born in a high-risk country and you have not received a shot to protect against hepatitis B (hepatitis B vaccine).  You have HIV or AIDS.  You use needles to inject street drugs.  You live with, or have sex with, someone who has hepatitis B.  You get hemodialysis treatment.  You take certain medicines for conditions like cancer, organ transplantation, and autoimmune conditions.  Hepatitis C blood testing is recommended for all people born from 1945 through 1965 and any individual with known risks for hepatitis C.  Practice safe sex. Use condoms and avoid high-risk sexual practices to reduce the spread of sexually transmitted infections (STIs). STIs include gonorrhea, chlamydia, syphilis, trichomonas, herpes, HPV, and human immunodeficiency virus (HIV). Herpes, HIV, and HPV are viral illnesses that have no cure. They can result in disability, cancer, and death.  You should be screened for sexually transmitted illnesses (STIs) including gonorrhea and chlamydia if:  You are sexually active and are younger than 24 years.  You are older than 24 years and your health care provider tells you that you are at risk for this type of infection.  Your sexual activity has changed since you were last screened and you are at an increased risk for chlamydia or gonorrhea. Ask your health care provider if you are at risk.  If you are at risk of being infected with HIV, it is recommended that you take a prescription medicine daily to prevent HIV infection. This is  called preexposure prophylaxis (PrEP). You are considered at risk if:  You are a heterosexual woman, are sexually active, and are at increased risk for HIV infection.  You take drugs by injection.  You are sexually active with a partner who has HIV.  Talk with your health care provider about whether you are at high risk of being infected with HIV. If you choose to begin PrEP, you should first be tested for HIV. You should then be tested every 3 months for as long as you are taking PrEP.  Osteoporosis is a disease in which the bones lose minerals and strength   with aging. This can result in serious bone fractures or breaks. The risk of osteoporosis can be identified using a bone density scan. Women ages 65 years and over and women at risk for fractures or osteoporosis should discuss screening with their health care providers. Ask your health care provider whether you should take a calcium supplement or vitamin D to reduce the rate of osteoporosis.  Menopause can be associated with physical symptoms and risks. Hormone replacement therapy is available to decrease symptoms and risks. You should talk to your health care provider about whether hormone replacement therapy is right for you.  Use sunscreen. Apply sunscreen liberally and repeatedly throughout the day. You should seek shade when your shadow is shorter than you. Protect yourself by wearing long sleeves, pants, a wide-brimmed hat, and sunglasses year round, whenever you are outdoors.  Once a month, do a whole body skin exam, using a mirror to look at the skin on your back. Tell your health care provider of new moles, moles that have irregular borders, moles that are larger than a pencil eraser, or moles that have changed in shape or color.  Stay current with required vaccines (immunizations).  Influenza vaccine. All adults should be immunized every year.  Tetanus, diphtheria, and acellular pertussis (Td, Tdap) vaccine. Pregnant women should  receive 1 dose of Tdap vaccine during each pregnancy. The dose should be obtained regardless of the length of time since the last dose. Immunization is preferred during the 27th-36th week of gestation. An adult who has not previously received Tdap or who does not know her vaccine status should receive 1 dose of Tdap. This initial dose should be followed by tetanus and diphtheria toxoids (Td) booster doses every 10 years. Adults with an unknown or incomplete history of completing a 3-dose immunization series with Td-containing vaccines should begin or complete a primary immunization series including a Tdap dose. Adults should receive a Td booster every 10 years.  Varicella vaccine. An adult without evidence of immunity to varicella should receive 2 doses or a second dose if she has previously received 1 dose. Pregnant females who do not have evidence of immunity should receive the first dose after pregnancy. This first dose should be obtained before leaving the health care facility. The second dose should be obtained 4-8 weeks after the first dose.  Human papillomavirus (HPV) vaccine. Females aged 13-26 years who have not received the vaccine previously should obtain the 3-dose series. The vaccine is not recommended for use in pregnant females. However, pregnancy testing is not needed before receiving a dose. If a female is found to be pregnant after receiving a dose, no treatment is needed. In that case, the remaining doses should be delayed until after the pregnancy. Immunization is recommended for any person with an immunocompromised condition through the age of 26 years if she did not get any or all doses earlier. During the 3-dose series, the second dose should be obtained 4-8 weeks after the first dose. The third dose should be obtained 24 weeks after the first dose and 16 weeks after the second dose.  Zoster vaccine. One dose is recommended for adults aged 60 years or older unless certain conditions are  present.  Measles, mumps, and rubella (MMR) vaccine. Adults born before 1957 generally are considered immune to measles and mumps. Adults born in 1957 or later should have 1 or more doses of MMR vaccine unless there is a contraindication to the vaccine or there is laboratory evidence of immunity to   each of the three diseases. A routine second dose of MMR vaccine should be obtained at least 28 days after the first dose for students attending postsecondary schools, health care workers, or international travelers. People who received inactivated measles vaccine or an unknown type of measles vaccine during 1963-1967 should receive 2 doses of MMR vaccine. People who received inactivated mumps vaccine or an unknown type of mumps vaccine before 1979 and are at high risk for mumps infection should consider immunization with 2 doses of MMR vaccine. For females of childbearing age, rubella immunity should be determined. If there is no evidence of immunity, females who are not pregnant should be vaccinated. If there is no evidence of immunity, females who are pregnant should delay immunization until after pregnancy. Unvaccinated health care workers born before 1957 who lack laboratory evidence of measles, mumps, or rubella immunity or laboratory confirmation of disease should consider measles and mumps immunization with 2 doses of MMR vaccine or rubella immunization with 1 dose of MMR vaccine.  Pneumococcal 13-valent conjugate (PCV13) vaccine. When indicated, a person who is uncertain of her immunization history and has no record of immunization should receive the PCV13 vaccine. An adult aged 19 years or older who has certain medical conditions and has not been previously immunized should receive 1 dose of PCV13 vaccine. This PCV13 should be followed with a dose of pneumococcal polysaccharide (PPSV23) vaccine. The PPSV23 vaccine dose should be obtained at least 8 weeks after the dose of PCV13 vaccine. An adult aged 19  years or older who has certain medical conditions and previously received 1 or more doses of PPSV23 vaccine should receive 1 dose of PCV13. The PCV13 vaccine dose should be obtained 1 or more years after the last PPSV23 vaccine dose.  Pneumococcal polysaccharide (PPSV23) vaccine. When PCV13 is also indicated, PCV13 should be obtained first. All adults aged 65 years and older should be immunized. An adult younger than age 65 years who has certain medical conditions should be immunized. Any person who resides in a nursing home or long-term care facility should be immunized. An adult smoker should be immunized. People with an immunocompromised condition and certain other conditions should receive both PCV13 and PPSV23 vaccines. People with human immunodeficiency virus (HIV) infection should be immunized as soon as possible after diagnosis. Immunization during chemotherapy or radiation therapy should be avoided. Routine use of PPSV23 vaccine is not recommended for American Indians, [place] Natives, or people younger than 65 years unless there are medical conditions that require PPSV23 vaccine. When indicated, people who have unknown immunization and have no record of immunization should receive PPSV23 vaccine. One-time revaccination 5 years after the first dose of PPSV23 is recommended for people aged 19-64 years who have chronic kidney failure, nephrotic syndrome, asplenia, or immunocompromised conditions. People who received 1-2 doses of PPSV23 before age 65 years should receive another dose of PPSV23 vaccine at age 65 years or later if at least 5 years have passed since the previous dose. Doses of PPSV23 are not needed for people immunized with PPSV23 at or after age 65 years.  Meningococcal vaccine. Adults with asplenia or persistent complement component deficiencies should receive 2 doses of quadrivalent meningococcal conjugate (MenACWY-D) vaccine. The doses should be obtained at least 2 months apart.  Microbiologists working with certain meningococcal bacteria, military recruits, people at risk during an outbreak, and people who travel to or live in countries with a high rate of meningitis should be immunized. A first-year college student up through age   21 years who is living in a residence hall should receive a dose if she did not receive a dose on or after her 16th birthday. Adults who have certain high-risk conditions should receive one or more doses of vaccine.  Hepatitis A vaccine. Adults who wish to be protected from this disease, have certain high-risk conditions, work with hepatitis A-infected animals, work in hepatitis A research labs, or travel to or work in countries with a high rate of hepatitis A should be immunized. Adults who were previously unvaccinated and who anticipate close contact with an international adoptee during the first 60 days after arrival in the [place] from a country with a high rate of hepatitis A should be immunized.  Hepatitis B vaccine. Adults who wish to be protected from this disease, have certain high-risk conditions, may be exposed to blood or other infectious body fluids, are household contacts or sex partners of hepatitis B positive people, are clients or workers in certain care facilities, or travel to or work in countries with a high rate of hepatitis B should be immunized.  Haemophilus influenzae type b (Hib) vaccine. A previously unvaccinated person with asplenia or sickle cell disease or having a scheduled splenectomy should receive 1 dose of Hib vaccine. Regardless of previous immunization, a recipient of a hematopoietic stem cell transplant should receive a 3-dose series 6-12 months after her successful transplant. Hib vaccine is not recommended for adults with HIV infection. Preventive Services / Frequency  Ages 19 to 39 years 1. Blood pressure check. 2. Lipid and cholesterol check. 3. Clinical breast exam.** / Every 3 years for women in their  20s and 30s. 4. BRCA-related cancer risk assessment.** / For women who have family members with a BRCA-related cancer (breast, ovarian, tubal, or peritoneal cancers). 5. Pap test.** / Every 2 years from ages 21 through 29. Every 3 years starting at age 30 through age 65 or 70 with a history of 3 consecutive normal Pap tests. 6. HPV screening.** / Every 3 years from ages 30 through ages 65 to 70 with a history of 3 consecutive normal Pap tests. 7. Hepatitis C blood test.** / For any individual with known risks for hepatitis C. 8. Skin self-exam. / Monthly. 9. Influenza vaccine. / Every year. 10. Tetanus, diphtheria, and acellular pertussis (Tdap, Td) vaccine.** / Consult your health care provider. Pregnant women should receive 1 dose of Tdap vaccine during each pregnancy. 1 dose of Td every 10 years. 11. Varicella vaccine.** / Consult your health care provider. Pregnant females who do not have evidence of immunity should receive the first dose after pregnancy. 12. HPV vaccine. / 3 doses over 6 months, if 26 and younger. The vaccine is not recommended for use in pregnant females. However, pregnancy testing is not needed before receiving a dose. 13. Measles, mumps, rubella (MMR) vaccine.** / You need at least 1 dose of MMR if you were born in 1957 or later. You may also need a 2nd dose. For females of childbearing age, rubella immunity should be determined. If there is no evidence of immunity, females who are not pregnant should be vaccinated. If there is no evidence of immunity, females who are pregnant should delay immunization until after pregnancy. 14. Pneumococcal 13-valent conjugate (PCV13) vaccine.** / Consult your health care provider. 15. Pneumococcal polysaccharide (PPSV23) vaccine.** / 1 to 2 doses if you smoke cigarettes or if you have certain conditions. 16. Meningococcal vaccine.** / 1 dose if you are age 19 to 21 years and a   first-year college student living in a residence hall, or have one  of several medical conditions, you need to get vaccinated against meningococcal disease. You may also need additional booster doses. 17. Hepatitis A vaccine.** / Consult your health care provider. 18. Hepatitis B vaccine.** / Consult your health care provider. 19. Haemophilus influenzae type b (Hib) vaccine.** / Consult your health care provider.  24. Chlamydia, HIV, and other sexually transmitted diseases- Get screened every year until age 54, then within three months of each new sexual provider. 21. Pap Smear- Every 1-3 years; discuss with your health care provider. 28. Mammogram- Every year starting at age 40  Take these steps 1. Do not smoke-Your healthcare provider can help you quit.  For tips on how to quit go to www.smokefree.gov or call 1-800 QUITNOW. 2. Be physically active- Exercise 5 days a week for at least 30 minutes.  If you are not already physically active, start slow and gradually work up to 30 minutes of moderate physical activity.  Examples of moderate activity include walking briskly, dancing, swimming, bicycling, etc. 3. Breast Cancer- A self breast exam every month is important for early detection of breast cancer.  For more information and instruction on self breast exams, ask your healthcare provider or https://www.patel.info/. 4. Eat a healthy diet- Eat a variety of healthy foods such as fruits, vegetables, whole grains, low fat milk, low fat cheeses, yogurt, lean meats, poultry and fish, beans, nuts, tofu, etc.  For more information go to www. Thenutritionsource.org 5. Drink alcohol in moderation- Limit alcohol intake to one drink or less per day. Never drink and drive. 6. Depression- Your emotional health is as important as your physical health.  If you're feeling down or losing interest in things you normally enjoy please talk to your healthcare provider about being screened for depression. 7. Dental visit- Brush and floss your teeth twice daily;  visit your dentist twice a year. 8. Eye doctor- Get an eye exam at least every 2 years. 9. Helmet use- Always wear a helmet when riding a bicycle, motorcycle, rollerblading or skateboarding. 6. Safe sex- If you may be exposed to sexually transmitted infections, use a condom. 11. Seat belts- Seat belts can save your live; always wear one. 12. Smoke/Carbon Monoxide detectors- These detectors need to be installed on the appropriate level of your home. Replace batteries at least once a year. 13. Skin cancer- When out in the sun please cover up and use sunscreen 15 SPF or higher. 14. Violence- If anyone is threatening or hurting you, please tell your healthcare provider.

## 2014-11-18 LAB — LIPID PANEL
Cholesterol: 96 mg/dL (ref 0–200)
HDL: 46 mg/dL (ref >=46)
LDL CALC: 40 mg/dL (ref 0–99)
Total CHOL/HDL Ratio: 2.1 Ratio
Triglycerides: 48 mg/dL (ref <150)
VLDL: 10 mg/dL (ref 0–40)

## 2014-11-18 LAB — URINALYSIS, ROUTINE W REFLEX MICROSCOPIC
BILIRUBIN URINE: NEGATIVE
GLUCOSE, UA: NEGATIVE mg/dL
Hgb urine dipstick: NEGATIVE
KETONES UR: NEGATIVE mg/dL
Nitrite: NEGATIVE
PH: 6 (ref 5.0–8.0)
Protein, ur: NEGATIVE mg/dL
SPECIFIC GRAVITY, URINE: 1.01 (ref 1.005–1.030)
Urobilinogen, UA: 0.2 mg/dL (ref 0.0–1.0)

## 2014-11-18 LAB — HEPATIC FUNCTION PANEL
ALBUMIN: 3.7 g/dL (ref 3.5–5.2)
ALK PHOS: 32 U/L — AB (ref 39–117)
ALT: 11 U/L (ref 0–35)
AST: 16 U/L (ref 0–37)
BILIRUBIN INDIRECT: 0.6 mg/dL (ref 0.2–1.2)
Bilirubin, Direct: 0.2 mg/dL (ref 0.0–0.3)
TOTAL PROTEIN: 5.8 g/dL — AB (ref 6.0–8.3)
Total Bilirubin: 0.8 mg/dL (ref 0.2–1.2)

## 2014-11-18 LAB — URINALYSIS, MICROSCOPIC ONLY
Casts: NONE SEEN
Crystals: NONE SEEN

## 2014-11-18 LAB — BASIC METABOLIC PANEL WITH GFR
BUN: 10 mg/dL (ref 6–23)
CO2: 26 meq/L (ref 19–32)
Calcium: 8.9 mg/dL (ref 8.4–10.5)
Chloride: 104 mEq/L (ref 96–112)
Creat: 0.83 mg/dL (ref 0.50–1.10)
GFR, Est African American: >89 mL/min
GFR, Est Non African American: >89 mL/min
GLUCOSE: 86 mg/dL (ref 70–99)
POTASSIUM: 4.2 meq/L (ref 3.5–5.3)
Sodium: 140 mEq/L (ref 135–145)

## 2014-11-18 LAB — TSH: TSH: 2.628 u[IU]/mL (ref 0.350–4.500)

## 2014-11-18 LAB — IRON AND TIBC
%SAT: 17 % — ABNORMAL LOW (ref 20–55)
IRON: 54 ug/dL (ref 42–145)
TIBC: 323 ug/dL (ref 250–470)
UIBC: 269 ug/dL (ref 125–400)

## 2014-11-18 LAB — FERRITIN: FERRITIN: 21 ng/mL (ref 10–291)

## 2014-11-18 LAB — MAGNESIUM: MAGNESIUM: 2.1 mg/dL (ref 1.5–2.5)

## 2014-11-18 LAB — VITAMIN D 25 HYDROXY (VIT D DEFICIENCY, FRACTURES): Vit D, 25-Hydroxy: 31 ng/mL (ref 30–100)

## 2014-11-18 LAB — VITAMIN B12: Vitamin B-12: 907 pg/mL (ref 211–911)

## 2014-11-20 LAB — METHYLMALONIC ACID, SERUM: METHYLMALONIC ACID, QUANT: 97 nmol/L (ref 87–318)

## 2014-11-30 ENCOUNTER — Encounter (INDEPENDENT_AMBULATORY_CARE_PROVIDER_SITE_OTHER): Payer: Self-pay

## 2014-11-30 ENCOUNTER — Other Ambulatory Visit: Payer: 59

## 2014-11-30 DIAGNOSIS — Z111 Encounter for screening for respiratory tuberculosis: Secondary | ICD-10-CM

## 2014-12-02 LAB — QUANTIFERON TB GOLD ASSAY (BLOOD)
Interferon Gamma Release Assay: NEGATIVE
Mitogen value: >10 IU/mL
Quantiferon Nil Value: 0.24 IU/mL
Quantiferon Tb Ag Minus Nil Value: 0 IU/mL
TB Ag value: 0.13 IU/mL

## 2015-05-04 ENCOUNTER — Encounter: Payer: Self-pay | Admitting: Internal Medicine

## 2015-05-04 ENCOUNTER — Ambulatory Visit (INDEPENDENT_AMBULATORY_CARE_PROVIDER_SITE_OTHER): Payer: 59 | Admitting: Internal Medicine

## 2015-05-04 VITALS — BP 118/68 | HR 56 | Temp 98.1°F | Resp 16 | Ht 64.0 in | Wt 146.4 lb

## 2015-05-04 DIAGNOSIS — M79674 Pain in right toe(s): Secondary | ICD-10-CM

## 2015-05-04 MED ORDER — TRAMADOL HCL 50 MG PO TABS: 50.0000 mg | ORAL_TABLET | Freq: Four times a day (QID) | ORAL | Status: DC | PRN

## 2015-05-04 NOTE — Progress Notes (Signed)
   Subjective:    Patient ID: Cassidy Short, female    DOB: 07/14/1980, 34 y.o.   MRN: [id number]  HPI  Patient presents to the office for evaluation for right toe injury.  She stubbed her toe yesterday morning.  She reports that she had some bruising immediately after.  She reports that she has taken ibuprofen 400 mg and 600 mg and that didn't really help.  No prior injury to the toe.    Review of Systems  Constitutional: Negative for fever, chills and fatigue.  Musculoskeletal: Positive for arthralgias.  Skin: Positive for color change.       Objective:   Physical Exam  Constitutional: She is oriented to person, place, and time. She appears well-developed and well-nourished. No distress.  HENT:  Head: Normocephalic.  Mouth/Throat: Oropharynx is clear and moist.  Eyes: Conjunctivae are normal. No scleral icterus.  Neck: Normal range of motion. Neck supple. No JVD present. No thyromegaly present.  Cardiovascular: Normal rate, regular rhythm, normal heart sounds and intact distal pulses.  Exam reveals no gallop and no friction rub.   No murmur heard. Pulmonary/Chest: Effort normal and breath sounds normal. No respiratory distress. She has no wheezes. She has no rales. She exhibits no tenderness.  Musculoskeletal: Normal range of motion.       Feet:  Lymphadenopathy:    She has no cervical adenopathy.  Neurological: She is alert and oriented to person, place, and time.  Skin: Skin is warm and dry. She is not diaphoretic.  Psychiatric: She has a normal mood and affect. Her behavior is normal. Judgment and thought content normal.  Nursing note and vitals reviewed.         Assessment & Plan:    1. Toe pain, right -likely fracture without deformity -buddy taping -RICE - traMADol (ULTRAM) 50 MG tablet; Take 1 tablet (50 mg total) by mouth every 6 (six) hours as needed.  Dispense: 30 tablet; Refill: 0

## 2015-11-20 ENCOUNTER — Encounter: Payer: Self-pay | Admitting: Physician Assistant

## 2016-01-01 ENCOUNTER — Ambulatory Visit (INDEPENDENT_AMBULATORY_CARE_PROVIDER_SITE_OTHER): Payer: 59 | Admitting: Physician Assistant

## 2016-01-01 ENCOUNTER — Encounter: Payer: Self-pay | Admitting: Physician Assistant

## 2016-01-01 VITALS — BP 120/74 | HR 68 | Temp 98.2°F | Resp 16 | Ht 64.5 in | Wt 140.0 lb

## 2016-01-01 DIAGNOSIS — Z1389 Encounter for screening for other disorder: Secondary | ICD-10-CM

## 2016-01-01 DIAGNOSIS — Z13220 Encounter for screening for lipoid disorders: Secondary | ICD-10-CM

## 2016-01-01 DIAGNOSIS — Z131 Encounter for screening for diabetes mellitus: Secondary | ICD-10-CM

## 2016-01-01 DIAGNOSIS — Z23 Encounter for immunization: Secondary | ICD-10-CM

## 2016-01-01 DIAGNOSIS — Z79899 Other long term (current) drug therapy: Secondary | ICD-10-CM

## 2016-01-01 DIAGNOSIS — E538 Deficiency of other specified B group vitamins: Secondary | ICD-10-CM

## 2016-01-01 DIAGNOSIS — E559 Vitamin D deficiency, unspecified: Secondary | ICD-10-CM

## 2016-01-01 DIAGNOSIS — F3342 Major depressive disorder, recurrent, in full remission: Secondary | ICD-10-CM

## 2016-01-01 DIAGNOSIS — Z0000 Encounter for general adult medical examination without abnormal findings: Secondary | ICD-10-CM

## 2016-01-01 DIAGNOSIS — T7840XD Allergy, unspecified, subsequent encounter: Secondary | ICD-10-CM

## 2016-01-01 DIAGNOSIS — Z111 Encounter for screening for respiratory tuberculosis: Secondary | ICD-10-CM

## 2016-01-01 DIAGNOSIS — F419 Anxiety disorder, unspecified: Secondary | ICD-10-CM

## 2016-01-01 LAB — CBC WITH DIFFERENTIAL/PLATELET
BASOS ABS: 0 {cells}/uL (ref 0–200)
Basophils Relative: 0 %
EOS ABS: 90 {cells}/uL (ref 15–500)
Eosinophils Relative: 2 %
HCT: 35.8 % (ref 35.0–45.0)
Hemoglobin: 12 g/dL (ref 11.7–15.5)
LYMPHS PCT: 42 %
Lymphs Abs: 1890 cells/uL (ref 850–3900)
MCH: 27 pg (ref 27.0–33.0)
MCHC: 33.5 g/dL (ref 32.0–36.0)
MCV: 80.6 fL (ref 80.0–100.0)
MONOS PCT: 7 %
MPV: 8.2 fL (ref 7.5–12.5)
Monocytes Absolute: 315 cells/uL (ref 200–950)
NEUTROS PCT: 49 %
Neutro Abs: 2205 cells/uL (ref 1500–7800)
Platelets: 220 10*3/uL (ref 140–400)
RBC: 4.44 MIL/uL (ref 3.80–5.10)
RDW: 13.1 % (ref 11.0–15.0)
WBC: 4.5 10*3/uL (ref 3.8–10.8)

## 2016-01-01 LAB — HEPATIC FUNCTION PANEL
ALK PHOS: 37 U/L (ref 33–115)
ALT: 11 U/L (ref 6–29)
AST: 17 U/L (ref 10–30)
Albumin: 4.1 g/dL (ref 3.6–5.1)
BILIRUBIN DIRECT: 0.1 mg/dL (ref <=0.2)
BILIRUBIN TOTAL: 0.6 mg/dL (ref 0.2–1.2)
Indirect Bilirubin: 0.5 mg/dL (ref 0.2–1.2)
Total Protein: 6.2 g/dL (ref 6.1–8.1)

## 2016-01-01 LAB — BASIC METABOLIC PANEL WITH GFR
BUN: 18 mg/dL (ref 7–25)
CHLORIDE: 104 mmol/L (ref 98–110)
CO2: 24 mmol/L (ref 20–31)
CREATININE: 0.94 mg/dL (ref 0.50–1.10)
Calcium: 8.9 mg/dL (ref 8.6–10.2)
GFR, Est African American: >89 mL/min (ref >=60)
GFR, Est Non African American: 78 mL/min (ref >=60)
Glucose, Bld: 87 mg/dL (ref 65–99)
POTASSIUM: 4.3 mmol/L (ref 3.5–5.3)
SODIUM: 136 mmol/L (ref 135–146)

## 2016-01-01 LAB — LIPID PANEL
CHOL/HDL RATIO: 1.9 ratio (ref <=5.0)
Cholesterol: 131 mg/dL (ref 125–200)
HDL: 70 mg/dL (ref >=46)
LDL CALC: 52 mg/dL (ref <130)
Triglycerides: 43 mg/dL (ref <150)
VLDL: 9 mg/dL (ref <30)

## 2016-01-01 LAB — MICROALBUMIN / CREATININE URINE RATIO
CREATININE, URINE: 220 mg/dL (ref 20–320)
MICROALB UR: 0.5 mg/dL
MICROALB/CREAT RATIO: 2 ug/mg{creat} (ref <30)

## 2016-01-01 LAB — IRON AND TIBC
%SAT: 12 % (ref 11–50)
IRON: 46 ug/dL (ref 40–190)
TIBC: 379 ug/dL (ref 250–450)
UIBC: 333 ug/dL (ref 125–400)

## 2016-01-01 LAB — URINALYSIS, ROUTINE W REFLEX MICROSCOPIC
BILIRUBIN URINE: NEGATIVE
Glucose, UA: NEGATIVE
Hgb urine dipstick: NEGATIVE
Ketones, ur: NEGATIVE
LEUKOCYTES UA: NEGATIVE
Nitrite: NEGATIVE
Protein, ur: NEGATIVE
SPECIFIC GRAVITY, URINE: 1.025 (ref 1.001–1.035)
pH: 5.5 (ref 5.0–8.0)

## 2016-01-01 LAB — TSH: TSH: 2.28 m[IU]/L

## 2016-01-01 LAB — FERRITIN: FERRITIN: 8 ng/mL — AB (ref 10–154)

## 2016-01-01 LAB — VITAMIN B12: Vitamin B-12: 776 pg/mL (ref 200–1100)

## 2016-01-01 LAB — MAGNESIUM: Magnesium: 1.9 mg/dL (ref 1.5–2.5)

## 2016-01-01 NOTE — Patient Instructions (Signed)
Preventive Care for Adults A healthy lifestyle and preventive care can promote health and wellness. Preventive health guidelines for women include the following key practices.  A routine yearly physical is a good way to check with your health care provider about your health and preventive screening. It is a chance to share any concerns and updates on your health and to receive a thorough exam.  Visit your dentist for a routine exam and preventive care every 6 months. Brush your teeth twice a day and floss once a day. Good oral hygiene prevents tooth decay and gum disease.  The frequency of eye exams is based on your age, health, family medical history, use of contact lenses, and other factors. Follow your health care provider's recommendations for frequency of eye exams.  Eat a healthy diet. Foods like vegetables, fruits, whole grains, low-fat dairy products, and lean protein foods contain the nutrients you need without too many calories. Decrease your intake of foods high in solid fats, added sugars, and salt. Eat the right amount of calories for you.Get information about a proper diet from your health care provider, if necessary.  Regular physical exercise is one of the most important things you can do for your health. Most adults should get at least 150 minutes of moderate-intensity exercise (any activity that increases your heart rate and causes you to sweat) each week. In addition, most adults need muscle-strengthening exercises on 2 or more days a week.  Maintain a healthy weight. The body mass index (BMI) is a screening tool to identify possible weight problems. It provides an estimate of body fat based on height and weight. Your health care provider can find your BMI and can help you achieve or maintain a healthy weight.For adults 20 years and older:  A BMI below 18.5 is considered underweight.  A BMI of 18.5 to 24.9 is normal.  A BMI of 25 to 29.9 is considered overweight.  A BMI of  30 and above is considered obese.  Maintain normal blood lipids and cholesterol levels by exercising and minimizing your intake of saturated fat. Eat a balanced diet with plenty of fruit and vegetables. Blood tests for lipids and cholesterol should begin at age 76 and be repeated every 5 years. If your lipid or cholesterol levels are high, you are over 50, or you are at high risk for heart disease, you may need your cholesterol levels checked more frequently.Ongoing high lipid and cholesterol levels should be treated with medicines if diet and exercise are not working.  If you smoke, find out from your health care provider how to quit. If you do not use tobacco, do not start.  Lung cancer screening is recommended for adults aged 22-80 years who are at high risk for developing lung cancer because of a history of smoking. A yearly low-dose CT scan of the lungs is recommended for people who have at least a 30-pack-year history of smoking and are a current smoker or have quit within the past 15 years. A pack year of smoking is smoking an average of 1 pack of cigarettes a day for 1 year (for example: 1 pack a day for 30 years or 2 packs a day for 15 years). Yearly screening should continue until the smoker has stopped smoking for at least 15 years. Yearly screening should be stopped for people who develop a health problem that would prevent them from having lung cancer treatment.  If you are pregnant, do not drink alcohol. If you are breastfeeding,  be very cautious about drinking alcohol. If you are not pregnant and choose to drink alcohol, do not have more than 1 drink per day. One drink is considered to be 12 ounces (355 mL) of beer, 5 ounces (148 mL) of wine, or 1.5 ounces (44 mL) of liquor.  Avoid use of street drugs. Do not share needles with anyone. Ask for help if you need support or instructions about stopping the use of drugs.  High blood pressure causes heart disease and increases the risk of  stroke. Your blood pressure should be checked at least every 1 to 2 years. Ongoing high blood pressure should be treated with medicines if weight loss and exercise do not work.  If you are 3-86 years old, ask your health care provider if you should take aspirin to prevent strokes.  Diabetes screening involves taking a blood sample to check your fasting blood sugar level. This should be done once every 3 years, after age 67, if you are within normal weight and without risk factors for diabetes. Testing should be considered at a younger age or be carried out more frequently if you are overweight and have at least 1 risk factor for diabetes.  Breast cancer screening is essential preventive care for women. You should practice "breast self-awareness." This means understanding the normal appearance and feel of your breasts and may include breast self-examination. Any changes detected, no matter how small, should be reported to a health care provider. Women in their 8s and 30s should have a clinical breast exam (CBE) by a health care provider as part of a regular health exam every 1 to 3 years. After age 70, women should have a CBE every year. Starting at age 25, women should consider having a mammogram (breast X-ray test) every year. Women who have a family history of breast cancer should talk to their health care provider about genetic screening. Women at a high risk of breast cancer should talk to their health care providers about having an MRI and a mammogram every year.  Breast cancer gene (BRCA)-related cancer risk assessment is recommended for women who have family members with BRCA-related cancers. BRCA-related cancers include breast, ovarian, tubal, and peritoneal cancers. Having family members with these cancers may be associated with an increased risk for harmful changes (mutations) in the breast cancer genes BRCA1 and BRCA2. Results of the assessment will determine the need for genetic counseling and  BRCA1 and BRCA2 testing.  Routine pelvic exams to screen for cancer are no longer recommended for nonpregnant women who are considered low risk for cancer of the pelvic organs (ovaries, uterus, and vagina) and who do not have symptoms. Ask your health care provider if a screening pelvic exam is right for you.  If you have had past treatment for cervical cancer or a condition that could lead to cancer, you need Pap tests and screening for cancer for at least 20 years after your treatment. If Pap tests have been discontinued, your risk factors (such as having a new sexual partner) need to be reassessed to determine if screening should be resumed. Some women have medical problems that increase the chance of getting cervical cancer. In these cases, your health care provider may recommend more frequent screening and Pap tests.  The HPV test is an additional test that may be used for cervical cancer screening. The HPV test looks for the virus that can cause the cell changes on the cervix. The cells collected during the Pap test can be  tested for HPV. The HPV test could be used to screen women aged 30 years and older, and should be used in women of any age who have unclear Pap test results. After the age of 30, women should have HPV testing at the same frequency as a Pap test.  Colorectal cancer can be detected and often prevented. Most routine colorectal cancer screening begins at the age of 50 years and continues through age 75 years. However, your health care provider may recommend screening at an earlier age if you have risk factors for colon cancer. On a yearly basis, your health care provider may provide home test kits to check for hidden blood in the stool. Use of a small camera at the end of a tube, to directly examine the colon (sigmoidoscopy or colonoscopy), can detect the earliest forms of colorectal cancer. Talk to your health care provider about this at age 50, when routine screening begins. Direct  exam of the colon should be repeated every 5-10 years through age 75 years, unless early forms of pre-cancerous polyps or small growths are found.  People who are at an increased risk for hepatitis B should be screened for this virus. You are considered at high risk for hepatitis B if:  You were born in a country where hepatitis B occurs often. Talk with your health care provider about which countries are considered high risk.  Your parents were born in a high-risk country and you have not received a shot to protect against hepatitis B (hepatitis B vaccine).  You have HIV or AIDS.  You use needles to inject street drugs.  You live with, or have sex with, someone who has hepatitis B.  You get hemodialysis treatment.  You take certain medicines for conditions like cancer, organ transplantation, and autoimmune conditions.  Hepatitis C blood testing is recommended for all people born from 1945 through 1965 and any individual with known risks for hepatitis C.  Practice safe sex. Use condoms and avoid high-risk sexual practices to reduce the spread of sexually transmitted infections (STIs). STIs include gonorrhea, chlamydia, syphilis, trichomonas, herpes, HPV, and human immunodeficiency virus (HIV). Herpes, HIV, and HPV are viral illnesses that have no cure. They can result in disability, cancer, and death.  You should be screened for sexually transmitted illnesses (STIs) including gonorrhea and chlamydia if:  You are sexually active and are younger than 24 years.  You are older than 24 years and your health care provider tells you that you are at risk for this type of infection.  Your sexual activity has changed since you were last screened and you are at an increased risk for chlamydia or gonorrhea. Ask your health care provider if you are at risk.  If you are at risk of being infected with HIV, it is recommended that you take a prescription medicine daily to prevent HIV infection. This is  called preexposure prophylaxis (PrEP). You are considered at risk if:  You are a heterosexual woman, are sexually active, and are at increased risk for HIV infection.  You take drugs by injection.  You are sexually active with a partner who has HIV.  Talk with your health care provider about whether you are at high risk of being infected with HIV. If you choose to begin PrEP, you should first be tested for HIV. You should then be tested every 3 months for as long as you are taking PrEP.  Osteoporosis is a disease in which the bones lose minerals and strength   with aging. This can result in serious bone fractures or breaks. The risk of osteoporosis can be identified using a bone density scan. Women ages 65 years and over and women at risk for fractures or osteoporosis should discuss screening with their health care providers. Ask your health care provider whether you should take a calcium supplement or vitamin D to reduce the rate of osteoporosis.  Menopause can be associated with physical symptoms and risks. Hormone replacement therapy is available to decrease symptoms and risks. You should talk to your health care provider about whether hormone replacement therapy is right for you.  Use sunscreen. Apply sunscreen liberally and repeatedly throughout the day. You should seek shade when your shadow is shorter than you. Protect yourself by wearing long sleeves, pants, a wide-brimmed hat, and sunglasses year round, whenever you are outdoors.  Once a month, do a whole body skin exam, using a mirror to look at the skin on your back. Tell your health care provider of new moles, moles that have irregular borders, moles that are larger than a pencil eraser, or moles that have changed in shape or color.  Stay current with required vaccines (immunizations).  Influenza vaccine. All adults should be immunized every year.  Tetanus, diphtheria, and acellular pertussis (Td, Tdap) vaccine. Pregnant women should  receive 1 dose of Tdap vaccine during each pregnancy. The dose should be obtained regardless of the length of time since the last dose. Immunization is preferred during the 27th-36th week of gestation. An adult who has not previously received Tdap or who does not know her vaccine status should receive 1 dose of Tdap. This initial dose should be followed by tetanus and diphtheria toxoids (Td) booster doses every 10 years. Adults with an unknown or incomplete history of completing a 3-dose immunization series with Td-containing vaccines should begin or complete a primary immunization series including a Tdap dose. Adults should receive a Td booster every 10 years.  Varicella vaccine. An adult without evidence of immunity to varicella should receive 2 doses or a second dose if she has previously received 1 dose. Pregnant females who do not have evidence of immunity should receive the first dose after pregnancy. This first dose should be obtained before leaving the health care facility. The second dose should be obtained 4-8 weeks after the first dose.  Human papillomavirus (HPV) vaccine. Females aged 13-26 years who have not received the vaccine previously should obtain the 3-dose series. The vaccine is not recommended for use in pregnant females. However, pregnancy testing is not needed before receiving a dose. If a female is found to be pregnant after receiving a dose, no treatment is needed. In that case, the remaining doses should be delayed until after the pregnancy. Immunization is recommended for any person with an immunocompromised condition through the age of 26 years if she did not get any or all doses earlier. During the 3-dose series, the second dose should be obtained 4-8 weeks after the first dose. The third dose should be obtained 24 weeks after the first dose and 16 weeks after the second dose.  Zoster vaccine. One dose is recommended for adults aged 60 years or older unless certain conditions are  present.  Measles, mumps, and rubella (MMR) vaccine. Adults born before 1957 generally are considered immune to measles and mumps. Adults born in 1957 or later should have 1 or more doses of MMR vaccine unless there is a contraindication to the vaccine or there is laboratory evidence of immunity to   each of the three diseases. A routine second dose of MMR vaccine should be obtained at least 28 days after the first dose for students attending postsecondary schools, health care workers, or international travelers. People who received inactivated measles vaccine or an unknown type of measles vaccine during 1963-1967 should receive 2 doses of MMR vaccine. People who received inactivated mumps vaccine or an unknown type of mumps vaccine before 1979 and are at high risk for mumps infection should consider immunization with 2 doses of MMR vaccine. For females of childbearing age, rubella immunity should be determined. If there is no evidence of immunity, females who are not pregnant should be vaccinated. If there is no evidence of immunity, females who are pregnant should delay immunization until after pregnancy. Unvaccinated health care workers born before 1957 who lack laboratory evidence of measles, mumps, or rubella immunity or laboratory confirmation of disease should consider measles and mumps immunization with 2 doses of MMR vaccine or rubella immunization with 1 dose of MMR vaccine.  Pneumococcal 13-valent conjugate (PCV13) vaccine. When indicated, a person who is uncertain of her immunization history and has no record of immunization should receive the PCV13 vaccine. An adult aged 19 years or older who has certain medical conditions and has not been previously immunized should receive 1 dose of PCV13 vaccine. This PCV13 should be followed with a dose of pneumococcal polysaccharide (PPSV23) vaccine. The PPSV23 vaccine dose should be obtained at least 8 weeks after the dose of PCV13 vaccine. An adult aged 19  years or older who has certain medical conditions and previously received 1 or more doses of PPSV23 vaccine should receive 1 dose of PCV13. The PCV13 vaccine dose should be obtained 1 or more years after the last PPSV23 vaccine dose.  Pneumococcal polysaccharide (PPSV23) vaccine. When PCV13 is also indicated, PCV13 should be obtained first. All adults aged 65 years and older should be immunized. An adult younger than age 65 years who has certain medical conditions should be immunized. Any person who resides in a nursing home or long-term care facility should be immunized. An adult smoker should be immunized. People with an immunocompromised condition and certain other conditions should receive both PCV13 and PPSV23 vaccines. People with human immunodeficiency virus (HIV) infection should be immunized as soon as possible after diagnosis. Immunization during chemotherapy or radiation therapy should be avoided. Routine use of PPSV23 vaccine is not recommended for American Indians, [place] Natives, or people younger than 65 years unless there are medical conditions that require PPSV23 vaccine. When indicated, people who have unknown immunization and have no record of immunization should receive PPSV23 vaccine. One-time revaccination 5 years after the first dose of PPSV23 is recommended for people aged 19-64 years who have chronic kidney failure, nephrotic syndrome, asplenia, or immunocompromised conditions. People who received 1-2 doses of PPSV23 before age 65 years should receive another dose of PPSV23 vaccine at age 65 years or later if at least 5 years have passed since the previous dose. Doses of PPSV23 are not needed for people immunized with PPSV23 at or after age 65 years.  Meningococcal vaccine. Adults with asplenia or persistent complement component deficiencies should receive 2 doses of quadrivalent meningococcal conjugate (MenACWY-D) vaccine. The doses should be obtained at least 2 months apart.  Microbiologists working with certain meningococcal bacteria, military recruits, people at risk during an outbreak, and people who travel to or live in countries with a high rate of meningitis should be immunized. A first-year college student up through age   21 years who is living in a residence hall should receive a dose if she did not receive a dose on or after her 16th birthday. Adults who have certain high-risk conditions should receive one or more doses of vaccine.  Hepatitis A vaccine. Adults who wish to be protected from this disease, have certain high-risk conditions, work with hepatitis A-infected animals, work in hepatitis A research labs, or travel to or work in countries with a high rate of hepatitis A should be immunized. Adults who were previously unvaccinated and who anticipate close contact with an international adoptee during the first 60 days after arrival in the [place] from a country with a high rate of hepatitis A should be immunized.  Hepatitis B vaccine. Adults who wish to be protected from this disease, have certain high-risk conditions, may be exposed to blood or other infectious body fluids, are household contacts or sex partners of hepatitis B positive people, are clients or workers in certain care facilities, or travel to or work in countries with a high rate of hepatitis B should be immunized.  Haemophilus influenzae type b (Hib) vaccine. A previously unvaccinated person with asplenia or sickle cell disease or having a scheduled splenectomy should receive 1 dose of Hib vaccine. Regardless of previous immunization, a recipient of a hematopoietic stem cell transplant should receive a 3-dose series 6-12 months after her successful transplant. Hib vaccine is not recommended for adults with HIV infection. Preventive Services / Frequency  Ages 19 to 39 years 1. Blood pressure check. 2. Lipid and cholesterol check. 3. Clinical breast exam.** / Every 3 years for women in their  20s and 30s. 4. BRCA-related cancer risk assessment.** / For women who have family members with a BRCA-related cancer (breast, ovarian, tubal, or peritoneal cancers). 5. Pap test.** / Every 2 years from ages 21 through 29. Every 3 years starting at age 30 through age 65 or 70 with a history of 3 consecutive normal Pap tests. 6. HPV screening.** / Every 3 years from ages 30 through ages 65 to 70 with a history of 3 consecutive normal Pap tests. 7. Hepatitis C blood test.** / For any individual with known risks for hepatitis C. 8. Skin self-exam. / Monthly. 9. Influenza vaccine. / Every year. 10. Tetanus, diphtheria, and acellular pertussis (Tdap, Td) vaccine.** / Consult your health care provider. Pregnant women should receive 1 dose of Tdap vaccine during each pregnancy. 1 dose of Td every 10 years. 11. Varicella vaccine.** / Consult your health care provider. Pregnant females who do not have evidence of immunity should receive the first dose after pregnancy. 12. HPV vaccine. / 3 doses over 6 months, if 26 and younger. The vaccine is not recommended for use in pregnant females. However, pregnancy testing is not needed before receiving a dose. 13. Measles, mumps, rubella (MMR) vaccine.** / You need at least 1 dose of MMR if you were born in 1957 or later. You may also need a 2nd dose. For females of childbearing age, rubella immunity should be determined. If there is no evidence of immunity, females who are not pregnant should be vaccinated. If there is no evidence of immunity, females who are pregnant should delay immunization until after pregnancy. 14. Pneumococcal 13-valent conjugate (PCV13) vaccine.** / Consult your health care provider. 15. Pneumococcal polysaccharide (PPSV23) vaccine.** / 1 to 2 doses if you smoke cigarettes or if you have certain conditions. 16. Meningococcal vaccine.** / 1 dose if you are age 19 to 21 years and a   first-year college student living in a residence hall, or have one  of several medical conditions, you need to get vaccinated against meningococcal disease. You may also need additional booster doses. 17. Hepatitis A vaccine.** / Consult your health care provider. 18. Hepatitis B vaccine.** / Consult your health care provider. 19. Haemophilus influenzae type b (Hib) vaccine.** / Consult your health care provider.  24. Chlamydia, HIV, and other sexually transmitted diseases- Get screened every year until age 54, then within three months of each new sexual provider. 21. Pap Smear- Every 1-3 years; discuss with your health care provider. 28. Mammogram- Every year starting at age 40  Take these steps 1. Do not smoke-Your healthcare provider can help you quit.  For tips on how to quit go to www.smokefree.gov or call 1-800 QUITNOW. 2. Be physically active- Exercise 5 days a week for at least 30 minutes.  If you are not already physically active, start slow and gradually work up to 30 minutes of moderate physical activity.  Examples of moderate activity include walking briskly, dancing, swimming, bicycling, etc. 3. Breast Cancer- A self breast exam every month is important for early detection of breast cancer.  For more information and instruction on self breast exams, ask your healthcare provider or https://www.patel.info/. 4. Eat a healthy diet- Eat a variety of healthy foods such as fruits, vegetables, whole grains, low fat milk, low fat cheeses, yogurt, lean meats, poultry and fish, beans, nuts, tofu, etc.  For more information go to www. Thenutritionsource.org 5. Drink alcohol in moderation- Limit alcohol intake to one drink or less per day. Never drink and drive. 6. Depression- Your emotional health is as important as your physical health.  If you're feeling down or losing interest in things you normally enjoy please talk to your healthcare provider about being screened for depression. 7. Dental visit- Brush and floss your teeth twice daily;  visit your dentist twice a year. 8. Eye doctor- Get an eye exam at least every 2 years. 9. Helmet use- Always wear a helmet when riding a bicycle, motorcycle, rollerblading or skateboarding. 6. Safe sex- If you may be exposed to sexually transmitted infections, use a condom. 11. Seat belts- Seat belts can save your live; always wear one. 12. Smoke/Carbon Monoxide detectors- These detectors need to be installed on the appropriate level of your home. Replace batteries at least once a year. 13. Skin cancer- When out in the sun please cover up and use sunscreen 15 SPF or higher. 14. Violence- If anyone is threatening or hurting you, please tell your healthcare provider.

## 2016-01-01 NOTE — Progress Notes (Signed)
Complete Physical  Assessment and Plan: Depression remission  Anxiety remission  Allergy, subsequent encounter Take OTC allergy pill  B12 deficiency Check labs - Vitamin B12  Vitamin D deficiency - Vit D  25 hydroxy (rtn osteoporosis monitoring)  Routine general medical examination at a health care facility Pap due 2018, TDAP   Need for diphtheria-tetanus-pertussis (Tdap) vaccine -     Tdap vaccine greater than or equal to 36yo IM  Screening for blood or protein in urine -     Urinalysis, Routine w reflex microscopic (not [facility]) -     Microalbumin / creatinine urine ratio  Screening cholesterol level -     Lipid panel  Screening for diabetes mellitus -     Hemoglobin A1c  Medication management -     CBC with Differential/Platelet -     BASIC METABOLIC PANEL WITH GFR -     Hepatic function panel -     Magnesium  Screening-pulmonary TB -     Quantiferon tb Gold Assay   Discussed med's effects and SE's. Screening labs and tests as requested with regular follow-up as recommended.  HPI 35 y.o. female  presents for a complete physical. Her blood pressure has been controlled at home, today their BP is   She does workoutShe denies chest pain, shortness of breath, dizziness.  She works as a Radiation protection practitionerparamedic, her and her husband divorced a year ago, she is dating someone at this time.  She is off wellbutrin and very rarely takes xanax, she is not seeing a counselor but feels she is doing well.  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  Does daily B12 gummies.    Current Medications:  Current Outpatient Prescriptions on File Prior to Visit  Medication Sig Dispense Refill  . ALPRAZolam (XANAX) 0.5 MG tablet 1/2-1 PRN TID for anxiety 30 tablet 0   No current facility-administered medications on file prior to visit.    Health Maintenance:   Immunization History  Administered Date(s) Administered  . Influenza-Unspecified 02/26/2014    Tetanus:  2007, GET TODAY Pneumovax: N/A Flu vaccine: 2015 Zostavax: N/A Pap: 2015, never abnormal pap, due next year MGM: N/A DEXA: Colonoscopy: EGD: Menses: Current, regular  Medical History:  Past Medical History:  Diagnosis Date  . Allergy   . Anxiety   . Depression    Allergies No Known Allergies  SURGICAL HISTORY She  has a past surgical history that includes Hernia repair (1986) and Tooth extraction (2002). FAMILY HISTORY Her family history includes Hypertension in her mother. SOCIAL HISTORY She  reports that she has never smoked. She has never used smokeless tobacco. She reports that she drinks alcohol. She reports that she does not use drugs.  Review of Systems  Constitutional: Negative.   HENT: Negative.   Eyes: Negative for blurred vision, double vision, photophobia, pain, discharge and redness.  Respiratory: Negative.   Cardiovascular: Negative.   Gastrointestinal: Negative.   Genitourinary: Negative.   Musculoskeletal: Negative.   Skin: Negative.   Neurological: Negative.   Endo/Heme/Allergies: Negative.   Psychiatric/Behavioral: Negative.      Physical Exam: Estimated body mass index is 25.13 kg/m as calculated from the following:   Height as of 05/04/15: 5\' 4"  (1.626 m).   Weight as of 05/04/15: 146 lb 6.4 oz (66.4 kg). There were no vitals taken for this visit. General Appearance: Well nourished, in no apparent distress. Eyes: PERRLA, EOMs, right lower inner lid with erythema,  swelling, crusting, tenderness to palpation, no surrounding erythema. normal fundi and vessels. Sinuses: No Frontal/maxillary tenderness ENT/Mouth: Ext aud canals clear, normal light reflex with TMs without erythema, bulging.  Good dentition. No erythema, swelling, or exudate on post pharynx. Tonsils not swollen or erythematous. Hearing normal.  Neck: Supple, thyroid normal. No bruits Respiratory: Respiratory effort normal, BS equal bilaterally without rales, rhonchi,  wheezing or stridor. Cardio: RRR without murmurs, rubs or gallops. Brisk peripheral pulses without edema.  Chest: symmetric, with normal excursions and percussion. Breasts: Symmetric, without lumps, nipple discharge, retractions. Abdomen: Soft, +BS. Non tender, no guarding, rebound, hernias, masses, or organomegaly. .  Lymphatics: Non tender without lymphadenopathy.  Genitourinary: defer  Musculoskeletal: Full ROM all peripheral extremities,5/5 strength, and normal gait. Skin: Warm, dry without rashes, lesions, ecchymosis.  Neuro: Cranial nerves intact, reflexes equal bilaterally. Normal muscle tone, no cerebellar symptoms. Sensation intact.  Psych: Awake and oriented X 3, normal affect, Insight and Judgment appropriate.    Cassidy Short 9:11 AM

## 2016-01-02 LAB — HEMOGLOBIN A1C
Hgb A1c MFr Bld: 5 % (ref <5.7)
Mean Plasma Glucose: 97 mg/dL

## 2016-01-02 LAB — VITAMIN D 25 HYDROXY (VIT D DEFICIENCY, FRACTURES): Vit D, 25-Hydroxy: 40 ng/mL (ref 30–100)

## 2016-01-03 LAB — QUANTIFERON TB GOLD ASSAY (BLOOD)
Interferon Gamma Release Assay: NEGATIVE
QUANTIFERON NIL VALUE: 0.04 [IU]/mL
QUANTIFERON TB AG MINUS NIL: 0 [IU]/mL

## 2016-03-11 ENCOUNTER — Encounter: Payer: Self-pay | Admitting: Physician Assistant

## 2016-04-01 ENCOUNTER — Encounter: Payer: Self-pay | Admitting: Physician Assistant

## 2016-04-01 ENCOUNTER — Ambulatory Visit (INDEPENDENT_AMBULATORY_CARE_PROVIDER_SITE_OTHER): Payer: 59 | Admitting: Physician Assistant

## 2016-04-01 VITALS — BP 118/74 | HR 86 | Temp 97.0°F | Resp 16 | Ht 64.5 in | Wt 143.0 lb

## 2016-04-01 DIAGNOSIS — F411 Generalized anxiety disorder: Secondary | ICD-10-CM | POA: Diagnosis not present

## 2016-04-01 DIAGNOSIS — F3342 Major depressive disorder, recurrent, in full remission: Secondary | ICD-10-CM | POA: Diagnosis not present

## 2016-04-01 MED ORDER — ALPRAZOLAM 0.5 MG PO TABS: ORAL_TABLET | ORAL | 0 refills | Status: DC

## 2016-04-01 MED ORDER — BUPROPION HCL ER (SR) 150 MG PO TB12: 150.0000 mg | ORAL_TABLET | Freq: Every day | ORAL | 2 refills | Status: DC

## 2016-04-01 MED ORDER — TRAZODONE HCL 50 MG PO TABS: ORAL_TABLET | ORAL | 0 refills | Status: DC

## 2016-04-01 NOTE — Progress Notes (Signed)
Subjective:    Patient ID: Cassidy Short, female    DOB: 03/10/1981, 35 y.o.   MRN: [id number]  HPI 35 y.o. WF presents tearful with anxiety and depression. Has been through a divorce, in school for nursing, falling out with best friend, had niece living with her but she has moved out, she has this semester and next semester graduates in may. Has been having issues with work, had a bad call in may, has had tension with supervisors. She has started counseling again, got back on wellbutrin, and would like FMLA filled out to be able to make her counseling appointments.   She is having trouble concentrating, not sleeping, trouble concentrating in class, if does not get break after 1 hour of class will get up and walk away. Decreased appetite. She has been on wellbutrin x 2 weeks, taking xanax at night for sleep but only getting 2-3 hours. Moving this month as well.   Blood pressure 118/74, pulse 86, temperature 97 F (36.1 C), resp. rate 16, height 5' 4.5" (1.638 m), weight 143 lb (64.9 kg), last menstrual period 03/22/2016, SpO2 98 %.  Medications Current Outpatient Prescriptions on File Prior to Visit  Medication Sig  . ALPRAZolam (XANAX) 0.5 MG tablet 1/2-1 PRN TID for anxiety  . Multiple Vitamins-Minerals (WOMENS MULTIVITAMIN PO) Take by mouth daily.   No current facility-administered medications on file prior to visit.     Problem list She has Major depression in full remission (HCC); Allergy; Anxiety; B12 deficiency; and Vitamin D deficiency on her problem list.   Review of Systems  Constitutional: Positive for appetite change. Negative for activity change, chills, diaphoresis, fever and unexpected weight change.  HENT: Negative.   Respiratory: Negative.   Cardiovascular: Negative.   Gastrointestinal: Negative.   Genitourinary: Negative.   Musculoskeletal: Negative.   Neurological: Negative.   Psychiatric/Behavioral: Positive for decreased concentration, dysphoric mood and sleep  disturbance. Negative for agitation, behavioral problems, confusion, hallucinations, self-injury and suicidal ideas. The patient is nervous/anxious. The patient is not hyperactive.        Objective:   Physical Exam  Constitutional: She is oriented to person, place, and time. She appears well-developed and well-nourished.  HENT:  Head: Normocephalic and atraumatic.  Right Ear: External ear normal.  Left Ear: External ear normal.  Mouth/Throat: Oropharynx is clear and moist.  Eyes: Conjunctivae and EOM are normal. Pupils are equal, round, and reactive to light.  Neck: Normal range of motion. Neck supple. No thyromegaly present.  Cardiovascular: Normal rate, regular rhythm and normal heart sounds.  Exam reveals no gallop and no friction rub.   No murmur heard. Pulmonary/Chest: Effort normal and breath sounds normal. No respiratory distress. She has no wheezes.  Abdominal: Soft. Bowel sounds are normal. She exhibits no distension and no mass. There is no tenderness. There is no rebound and no guarding.  Musculoskeletal: Normal range of motion.  Lymphadenopathy:    She has no cervical adenopathy.  Neurological: She is alert and oriented to person, place, and time. She displays normal reflexes. No cranial nerve deficit. Coordination normal.  Skin: Skin is warm and dry.  Psychiatric: Her speech is normal and behavior is normal. Judgment normal. Her mood appears anxious. Her affect is not angry, not blunt, not labile and not inappropriate. Cognition and memory are normal. She exhibits a depressed mood. She expresses no homicidal and no suicidal ideation. She expresses no suicidal plans and no homicidal plans.          Assessment &  Plan:  Depression/anxiety/decreased concentration Will give trazodone 25mg , can do 1/2-2 at night Xanax 0.5 PRN for sleep or anxiety Continue wellbutrin for now if this does not help in another 2 weeks will try celexa.  Will fill out FMLA to miss one day a week  for appointments, will message in 2-4 weeks, follow up 6 weeks.

## 2016-04-02 ENCOUNTER — Encounter: Payer: Self-pay | Admitting: Physician Assistant

## 2016-04-16 ENCOUNTER — Other Ambulatory Visit: Payer: Self-pay | Admitting: Physician Assistant

## 2016-04-16 ENCOUNTER — Encounter: Payer: Self-pay | Admitting: Physician Assistant

## 2016-04-16 DIAGNOSIS — F411 Generalized anxiety disorder: Secondary | ICD-10-CM

## 2016-04-16 MED ORDER — BUPROPION HCL ER (XL) 300 MG PO TB24: 300.0000 mg | ORAL_TABLET | ORAL | 2 refills | Status: DC

## 2016-05-29 ENCOUNTER — Encounter: Payer: Self-pay | Admitting: Physician Assistant

## 2016-05-29 ENCOUNTER — Ambulatory Visit (INDEPENDENT_AMBULATORY_CARE_PROVIDER_SITE_OTHER): Payer: 59 | Admitting: Physician Assistant

## 2016-05-29 VITALS — BP 120/60 | HR 71 | Temp 97.7°F | Resp 14 | Ht 64.5 in | Wt 142.4 lb

## 2016-05-29 DIAGNOSIS — E559 Vitamin D deficiency, unspecified: Secondary | ICD-10-CM | POA: Diagnosis not present

## 2016-05-29 DIAGNOSIS — F3342 Major depressive disorder, recurrent, in full remission: Secondary | ICD-10-CM | POA: Diagnosis not present

## 2016-05-29 NOTE — Progress Notes (Signed)
   Subjective:    Patient ID: Cassidy Short, female    DOB: 07/20/1980, 36 y.o.   MRN: [id number]  HPI 36 y.o. WF presents for follow up depression/anxiety, she has been stressed with nursing school, work, divorce, friends, etc She was started on trazodone for sleep, xanax PRN, Wellbutrin, and given FMLA leave for counseling once a week. She presents states she is doing better, she has different work schedule and Designer, jewellerydifferent supervisors, she is seeing a Veterinary surgeoncounselor, on wellbutrin and doing well.   Blood pressure 120/60, pulse 71, temperature 97.7 F (36.5 C), resp. rate 14, height 5' 4.5" (1.638 m), weight 142 lb 6.4 oz (64.6 kg), last menstrual period 05/09/2016, SpO2 98 %.  Medications Current Outpatient Prescriptions on File Prior to Visit  Medication Sig  . ALPRAZolam (XANAX) 0.5 MG tablet 1/2-1 PRN TID for anxiety  . buPROPion (WELLBUTRIN XL) 300 MG 24 hr tablet Take 1 tablet (300 mg total) by mouth every morning.  . Multiple Vitamins-Minerals (WOMENS MULTIVITAMIN PO) Take by mouth daily.   No current facility-administered medications on file prior to visit.     Problem list She has Major depression in full remission (HCC); Allergy; Anxiety; B12 deficiency; and Vitamin D deficiency on her problem list.  Review of Systems  Constitutional: Negative.   HENT: Negative.   Respiratory: Negative.   Cardiovascular: Negative.   Gastrointestinal: Negative.   Genitourinary: Negative.   Musculoskeletal: Negative.   Skin: Negative.   Neurological: Negative.   Hematological: Negative.   Psychiatric/Behavioral: Negative.        Objective:   Physical Exam  Constitutional: She is oriented to person, place, and time. She appears well-developed and well-nourished.  HENT:  Head: Normocephalic and atraumatic.  Right Ear: External ear normal.  Left Ear: External ear normal.  Mouth/Throat: Oropharynx is clear and moist.  Eyes: Conjunctivae and EOM are normal. Pupils are equal, round, and reactive  to light.  Neck: Normal range of motion. Neck supple. No thyromegaly present.  Cardiovascular: Normal rate, regular rhythm and normal heart sounds.  Exam reveals no gallop and no friction rub.   No murmur heard. Pulmonary/Chest: Effort normal and breath sounds normal. No respiratory distress. She has no wheezes.  Abdominal: Soft. Bowel sounds are normal. She exhibits no distension and no mass. There is no tenderness. There is no rebound and no guarding.  Musculoskeletal: Normal range of motion.  Lymphadenopathy:    She has no cervical adenopathy.  Neurological: She is alert and oriented to person, place, and time. She displays normal reflexes. No cranial nerve deficit. Coordination normal.  Skin: Skin is warm and dry.  Psychiatric: Her speech is normal and behavior is normal. Judgment normal. Her mood appears not anxious. Her affect is not angry, not blunt, not labile and not inappropriate. Cognition and memory are normal. She does not exhibit a depressed mood. She expresses no homicidal and no suicidal ideation. She expresses no suicidal plans and no homicidal plans.        Assessment & Plan:  1. Recurrent major depressive disorder, in full remission (HCC) Continue counseling, continue wellbutrin, follow up AS needed or will see again in August.    Future Appointments Date Time Provider Department Center  12/31/2016 9:00 AM Quentin MullingAmanda Neta Upadhyay, PA-C [facility] None

## 2016-05-29 NOTE — Patient Instructions (Signed)
Fatigue Introduction Fatigue is feeling tired all of the time, a lack of energy, or a lack of motivation. Occasional or mild fatigue is often a normal response to activity or life in general. However, long-lasting (chronic) or extreme fatigue may indicate an underlying medical condition. Follow these instructions at home: Watch your fatigue for any changes. The following actions may help to lessen any discomfort you are feeling:  Talk to your health care provider about how much sleep you need each night. Try to get the required amount every night.  Take medicines only as directed by your health care provider.  Eat a healthy and nutritious diet. Ask your health care provider if you need help changing your diet.  Drink enough fluid to keep your urine clear or pale yellow.  Practice ways of relaxing, such as yoga, meditation, massage therapy, or acupuncture.  Exercise regularly.  Change situations that cause you stress. Try to keep your work and personal routine reasonable.  Do not abuse illegal drugs.  Limit alcohol intake to no more than 1 drink per day for nonpregnant women and 2 drinks per day for men. One drink equals 12 ounces of beer, 5 ounces of wine, or 1 ounces of hard liquor.  Take a multivitamin, if directed by your health care provider. Contact a health care provider if:  Your fatigue does not get better.  You have a fever.  You have unintentional weight loss or gain.  You have headaches.  You have difficulty:  Falling asleep.  Sleeping throughout the night.  You feel angry, guilty, anxious, or sad.  You are unable to have a bowel movement (constipation).  You skin is dry.  Your legs or another part of your body is swollen. Get help right away if:  You feel confused.  Your vision is blurry.  You feel faint or pass out.  You have a severe headache.  You have severe abdominal, pelvic, or back pain.  You have chest pain, shortness of breath, or an  irregular or fast heartbeat.  You are unable to urinate or you urinate less than normal.  You develop abnormal bleeding, such as bleeding from the rectum, vagina, nose, lungs, or nipples.  You vomit blood.  You have thoughts about harming yourself or committing suicide.  You are worried that you might harm someone else. This information is not intended to replace advice given to you by your health care provider. Make sure you discuss any questions you have with your health care provider. Document Released: 03/10/2007 Document Revised: 10/19/2015 Document Reviewed: 09/14/2013  2017 Elsevier  

## 2016-09-12 ENCOUNTER — Encounter: Payer: Self-pay | Admitting: Physician Assistant

## 2016-09-12 ENCOUNTER — Ambulatory Visit (INDEPENDENT_AMBULATORY_CARE_PROVIDER_SITE_OTHER): Payer: 59 | Admitting: Physician Assistant

## 2016-09-12 VITALS — BP 110/82 | HR 85 | Temp 98.1°F | Resp 16 | Ht 64.5 in | Wt 150.2 lb

## 2016-09-12 DIAGNOSIS — M545 Low back pain, unspecified: Secondary | ICD-10-CM

## 2016-09-12 MED ORDER — TRAMADOL HCL 50 MG PO TABS: ORAL_TABLET | ORAL | 0 refills | Status: DC

## 2016-09-12 MED ORDER — CYCLOBENZAPRINE HCL 10 MG PO TABS: 10.0000 mg | ORAL_TABLET | Freq: Three times a day (TID) | ORAL | 0 refills | Status: DC | PRN

## 2016-09-12 MED ORDER — DEXAMETHASONE SODIUM PHOSPHATE 100 MG/10ML IJ SOLN
10.0000 mg | Freq: Once | INTRAMUSCULAR | Status: AC
  Administered 2016-09-12: 10 mg via INTRAMUSCULAR

## 2016-09-12 NOTE — Patient Instructions (Signed)

## 2016-09-12 NOTE — Progress Notes (Signed)
   Subjective:    Patient ID: Cassidy Short, female    DOB: 1981/03/19, 36 y.o.   MRN: [id number]  HPI 36 y.o. WF presents with back pain x 5 days. 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 Having lower left back, better with sitting still, better with pressure.  Given percocet and motrin, percocet did not help so has been taking tramadol. Worse getting in and out of vehicule, which is bad for EMS, and twisting/bending. Patient denies fever, hematuria, incontinence, numbness, tingling, weakness and saddle anesthesia   Blood pressure 110/82, pulse 85, temperature 98.1 F (36.7 C), resp. rate 16, height 5' 4.5" (1.638 m), weight 150 lb 3.2 oz (68.1 kg), last menstrual period 09/03/2016, SpO2 98 %.  Medications Current Outpatient Prescriptions on File Prior to Visit  Medication Sig  . ALPRAZolam (XANAX) 0.5 MG tablet 1/2-1 PRN TID for anxiety  . buPROPion (WELLBUTRIN XL) 300 MG 24 hr tablet Take 1 tablet (300 mg total) by mouth every morning.  . Multiple Vitamins-Minerals (WOMENS MULTIVITAMIN PO) Take by mouth daily.   No current facility-administered medications on file prior to visit.     Problem list She has Major depression in full remission (HCC); Allergy; Anxiety; B12 deficiency; and Vitamin D deficiency on her problem list.   Review of Systems  Constitutional: Negative for chills, fatigue and fever.  Gastrointestinal: Negative for nausea and vomiting.  Genitourinary: Negative for difficulty urinating, dysuria, flank pain, frequency, hematuria and urgency.  Musculoskeletal: Positive for back pain.       Objective:   Physical Exam  Constitutional: She is oriented to person, place, and time. She appears well-developed and well-nourished.  HENT:  Head: Normocephalic and atraumatic.  Eyes: Conjunctivae are normal. Pupils are equal, round, and reactive to light.  Neck: Normal  range of motion. Neck supple.  Cardiovascular: Normal rate and regular rhythm.   Pulmonary/Chest: Effort normal and breath sounds normal.  Abdominal: Soft. Bowel sounds are normal. There is no tenderness.  Musculoskeletal:  Patient is able to ambulate well. Gait is  Antalgic. Straight leg raising with dorsiflexion negative bilaterally for radicular symptoms. Sensory exam in the legs are normal. Knee reflexes are normal Ankle reflexes are normal Strength is normal and symmetric in arms and legs. There is not SI tenderness to palpation.  There is pin point paraspinal muscle spasm/tenderness to the left at T11.  There is not midline tenderness.  ROM of spine with  limited in all spheres due to pain.   Lymphadenopathy:    She has no cervical adenopathy.  Neurological: She is alert and oriented to person, place, and time. She has normal reflexes.  Skin: Skin is warm and dry. No rash noted.       Assessment & Plan:  Back pain, point point, negative straight leg, no bowel/bladder problems Injection- area cleaned with alcohol, Dexamethasone  and 1 CC lidocaine injected into left paraspinals, tolearted well with immediate relief.  Mobic, RICE, and exercise given If not better with refer to orthopedics.  Note for work until 04/25, EMS

## 2016-09-18 ENCOUNTER — Encounter: Payer: Self-pay | Admitting: Physician Assistant

## 2016-09-19 NOTE — Addendum Note (Signed)
Addended by: Quentin Mulling R on: 09/19/2016 12:48 PM   Modules accepted: Orders

## 2016-10-03 ENCOUNTER — Encounter: Payer: Self-pay | Admitting: Physician Assistant

## 2016-11-07 ENCOUNTER — Ambulatory Visit (INDEPENDENT_AMBULATORY_CARE_PROVIDER_SITE_OTHER): Payer: Self-pay | Admitting: Specialist

## 2016-11-21 ENCOUNTER — Encounter: Payer: Self-pay | Admitting: Physician Assistant

## 2016-12-31 ENCOUNTER — Encounter: Payer: Self-pay | Admitting: Physician Assistant

## 2017-05-27 NOTE — L&D Delivery Note (Signed)
(  suggestion)  The Delivery Summary is signed.  Cassidy Short, Girl [name]    Recommended Delivery Documentation Complete   Rupture of Membranes / Labor Complications   Rupture Date:  04/08/18    Rupture Time:  4:30 AM    Rupture Type: Spontaneous  Fluid Color: Clear  Induction / Augmentation  Induction: None  Augmentation: None  Lacerations / Episiotomy / Blood Loss   Surgical or additional blood loss (mL): 269  Combined blood loss (mL): 269   Rupture of Membranes / Labor Complications   Rupture Date:  04/08/18    Rupture Time:  4:30 AM    Rupture Type: Spontaneous  Fluid Color: Clear  Induction / Augmentation  Induction: None  Augmentation: None  Labor Length   3rd stage: 0h 3583m  Presentation   Presentation: Homero FellersFrank Breech  Measurements   Weight: 2750 g  Weight:, English: 6 lb 1 oz  Length: 46.4 cm  Length, English: 18.25"  Head circumference: 33.7 cm  Head circumference, English: 13.25"  Chest circumference: 31.8 cm  Chest circumference, English: 12.5"   Delivery Information   Birth date: [birth year] Time: 1254  Sex: Female  Delivery type: C-Section, Low Transverse  C-Section Incidence: Primary  C-Section Indications: Malpresentation  Anesthesia   Method: Spinal  Blood Loss  Mother: Devin GoingCoe, Jeaninne #[id number]   Start of Mother's Information   IO Blood Loss  04/08/18 1250 - 04/09/18 1254  Mom's I/O Activity  EBL Anesthesia 269 mL  Total  269 mL  End of Mother's Information  Mother: Devin GoingCoe, Liesl #[id number]  Vaginal Delivery Counts   No data filed  Delivery Providers   Delivering clinician: Olivia Mackieaavon, Richard, MD  Provider Role  Jason FilaKrist, Katherine, NP Neonatal Nurse Practitioner  Andree Moroarlos, Rita, MD Neonatologist  Delynn FlavinStout, Catherine M, RN   Sexton, Bethann Gooabatha D   Paul, Daniela C, CNM    Apgars   Living status: Living  Apgars Component 1 min. 5 min.  Skin color:  1  1   Heart rate:  2  2   Reflex irritability:  2  2   Muscle tone:  2  2   Respiratory effort:  2   2   Total:  9  9   Cord Information   Vessels: 3 vessels  Delayed Cord Clamping?: Yes  Ligated to Reduce: No  Cord blood collected for donation: None  Gases sent?: No  Placenta   Placenta delivery date/time: 04/08/2018 1256  Placenta removal: Manual removal  Placenta appearance: Intact

## 2017-08-29 NOTE — Progress Notes (Deleted)
Complete Physical  Assessment and Plan: Depression remission  Anxiety remission  Allergy, subsequent encounter Take OTC allergy pill  B12 deficiency Check labs - Vitamin B12  Vitamin D deficiency - Vit D  25 hydroxy (rtn osteoporosis monitoring)  Routine general medical examination at a health care facility Pap due 2018, TDAP   Need for diphtheria-tetanus-pertussis (Tdap) vaccine -     Tdap vaccine greater than or equal to 38yo IM  Screening for blood or protein in urine -     Urinalysis, Routine w reflex microscopic (not at [facility]) -     Microalbumin / creatinine urine ratio  Screening cholesterol level -     Lipid panel  Screening for diabetes mellitus -     Hemoglobin A1c  Medication management -     CBC with Differential/Platelet -     BASIC METABOLIC PANEL WITH GFR -     Hepatic function panel -     Magnesium  Screening-pulmonary TB -     Quantiferon tb Gold Assay   Discussed med's effects and SE's. Screening labs and tests as requested with regular follow-up as recommended.  HPI 37 y.o. female  presents for a complete physical. Her blood pressure has been controlled at home, today their BP is   She does workoutShe denies chest pain, shortness of breath, dizziness.  She works as a Radiation protection practitionerparamedic, her and her husband divorced a year ago, she is dating someone at this time.  She is off wellbutrin and very rarely takes xanax, she is not seeing a counselor but feels she is doing well.  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  Does daily B12 gummies.    Current Medications:  Current Outpatient Medications on File Prior to Visit  Medication Sig Dispense Refill  . ALPRAZolam (XANAX) 0.5 MG tablet 1/2-1 PRN TID for anxiety 30 tablet 0  . buPROPion (WELLBUTRIN XL) 300 MG 24 hr tablet Take 1 tablet (300 mg total) by mouth every morning. 30 tablet 2  . cyclobenzaprine (FLEXERIL) 10 MG tablet Take 1 tablet (10 mg total) by mouth 3  (three) times daily as needed for muscle spasms. 30 tablet 0  . Multiple Vitamins-Minerals (WOMENS MULTIVITAMIN PO) Take by mouth daily.    . traMADol (ULTRAM) 50 MG tablet 1 pill twice daily as needed for pain 60 tablet 0   No current facility-administered medications on file prior to visit.    Health Maintenance:   Immunization History  Administered Date(s) Administered  . Influenza-Unspecified 02/26/2014  . Tdap 01/01/2016   Tetanus:  2007, GET TODAY Pneumovax: N/A Flu vaccine: 2015 Zostavax: N/A Pap: 2015, never abnormal pap, due next year MGM: N/A DEXA: Colonoscopy: EGD: Menses: Current, regular  Medical History:  Past Medical History:  Diagnosis Date  . Allergy   . Anxiety   . Depression    Allergies No Known Allergies  SURGICAL HISTORY She  has a past surgical history that includes Hernia repair (1986) and Tooth extraction (2002). FAMILY HISTORY Her family history includes Hypertension in her mother. SOCIAL HISTORY She  reports that she has never smoked. She has never used smokeless tobacco. She reports that she drinks alcohol. She reports that she does not use drugs.  Review of Systems  Constitutional: Negative.   HENT: Negative.   Eyes: Negative for blurred vision, double vision, photophobia, pain, discharge and redness.  Respiratory: Negative.   Cardiovascular: Negative.   Gastrointestinal: Negative.   Genitourinary: Negative.  Musculoskeletal: Negative.   Skin: Negative.   Neurological: Negative.   Endo/Heme/Allergies: Negative.   Psychiatric/Behavioral: Negative.      Physical Exam: Estimated body mass index is 25.38 kg/m as calculated from the following:   Height as of 09/12/16: 5' 4.5" (1.638 m).   Weight as of 09/12/16: 150 lb 3.2 oz (68.1 kg). There were no vitals taken for this visit. General Appearance: Well nourished, in no apparent distress. Eyes: PERRLA, EOMs, right lower inner lid with erythema, swelling, crusting, tenderness to  palpation, no surrounding erythema. normal fundi and vessels. Sinuses: No Frontal/maxillary tenderness ENT/Mouth: Ext aud canals clear, normal light reflex with TMs without erythema, bulging.  Good dentition. No erythema, swelling, or exudate on post pharynx. Tonsils not swollen or erythematous. Hearing normal.  Neck: Supple, thyroid normal. No bruits Respiratory: Respiratory effort normal, BS equal bilaterally without rales, rhonchi, wheezing or stridor. Cardio: RRR without murmurs, rubs or gallops. Brisk peripheral pulses without edema.  Chest: symmetric, with normal excursions and percussion. Breasts: Symmetric, without lumps, nipple discharge, retractions. Abdomen: Soft, +BS. Non tender, no guarding, rebound, hernias, masses, or organomegaly. .  Lymphatics: Non tender without lymphadenopathy.  Genitourinary: defer  Musculoskeletal: Full ROM all peripheral extremities,5/5 strength, and normal gait. Skin: Warm, dry without rashes, lesions, ecchymosis.  Neuro: Cranial nerves intact, reflexes equal bilaterally. Normal muscle tone, no cerebellar symptoms. Sensation intact.  Psych: Awake and oriented X 3, normal affect, Insight and Judgment appropriate.    Cassidy Short 7:39 AM

## 2017-09-02 ENCOUNTER — Encounter: Payer: Self-pay | Admitting: Physician Assistant

## 2017-10-14 DIAGNOSIS — N925 Other specified irregular menstruation: Secondary | ICD-10-CM | POA: Diagnosis not present

## 2017-10-14 DIAGNOSIS — Z0183 Encounter for blood typing: Secondary | ICD-10-CM | POA: Diagnosis not present

## 2017-10-14 DIAGNOSIS — O09511 Supervision of elderly primigravida, first trimester: Secondary | ICD-10-CM | POA: Diagnosis not present

## 2017-10-23 ENCOUNTER — Encounter: Payer: Self-pay | Admitting: Internal Medicine

## 2017-10-23 ENCOUNTER — Ambulatory Visit (INDEPENDENT_AMBULATORY_CARE_PROVIDER_SITE_OTHER): Payer: 59 | Admitting: Internal Medicine

## 2017-10-23 VITALS — BP 110/68 | HR 60 | Temp 97.0°F | Resp 16 | Ht 64.0 in | Wt 148.2 lb

## 2017-10-23 DIAGNOSIS — Z136 Encounter for screening for cardiovascular disorders: Secondary | ICD-10-CM | POA: Diagnosis not present

## 2017-10-23 DIAGNOSIS — Z1212 Encounter for screening for malignant neoplasm of rectum: Secondary | ICD-10-CM

## 2017-10-23 DIAGNOSIS — E559 Vitamin D deficiency, unspecified: Secondary | ICD-10-CM

## 2017-10-23 DIAGNOSIS — Z0000 Encounter for general adult medical examination without abnormal findings: Secondary | ICD-10-CM | POA: Diagnosis not present

## 2017-10-23 DIAGNOSIS — E538 Deficiency of other specified B group vitamins: Secondary | ICD-10-CM

## 2017-10-23 DIAGNOSIS — Z1211 Encounter for screening for malignant neoplasm of colon: Secondary | ICD-10-CM

## 2017-10-23 DIAGNOSIS — R5383 Other fatigue: Secondary | ICD-10-CM | POA: Diagnosis not present

## 2017-10-23 DIAGNOSIS — I10 Essential (primary) hypertension: Secondary | ICD-10-CM | POA: Diagnosis not present

## 2017-10-23 DIAGNOSIS — R7303 Prediabetes: Secondary | ICD-10-CM

## 2017-10-23 DIAGNOSIS — Z0001 Encounter for general adult medical examination with abnormal findings: Secondary | ICD-10-CM

## 2017-10-23 DIAGNOSIS — Z79899 Other long term (current) drug therapy: Secondary | ICD-10-CM | POA: Diagnosis not present

## 2017-10-23 NOTE — Progress Notes (Signed)
[facility] Lucky Cowboy, M.D.     Dyanne Carrel. Steffanie Dunn, P.A.-C Judd Gaudier, DNP [facility] [address] [place], [place]. [address] Telephone [phone] Telefax [phone] Annual Screening/Preventative Visit & Comprehensive Evaluation &  Examination     This very nice 37 y.o.  single WF who  presents for a Screening/Preventative Visit & comprehensive evaluation and management. Patient relates that she is 14 weeks Gravid.      Patient's BP has been controlled at home and patient denies any cardiac symptoms as chest pain, palpitations, shortness of breath, dizziness or ankle swelling. Today's BP is at 110/68      Patient's hyperlipidemia is controlled with diet. Last lipids were at goal: Lab Results  Component Value Date   CHOL 131 01/01/2016   HDL 70 01/01/2016   LDLCALC 52 01/01/2016   TRIG 43 01/01/2016   CHOLHDL 1.9 01/01/2016      Patient has prediabetes (A1c 5.7%/Insulin 39/2015) and patient denies reactive hypoglycemic symptoms, visual blurring, diabetic polys, or paresthesias. Last A1c was Normal & at goal: Lab Results  Component Value Date   HGBA1C 5.0 01/01/2016      Patient has hx/o a low Vit B12 of "326" in 2015. Finally, patient has history of Vitamin D Deficiency ("36"/2015) and last Vitamin D was still low: Lab Results  Component Value Date   VD25OH 40 01/01/2016   Current Outpatient Medications on File Prior to Visit  Medication Sig  . Multiple Vitamins-Minerals (WOMENS MULTIVITAMIN PO) Take by mouth daily.   No current facility-administered medications on file prior to visit.    No Known Allergies   Past Medical History:  Diagnosis Date  . Allergy   . Anxiety   . Depression    Health Maintenance  Topic Date Due  . HIV Screening  09/27/1994  . PAP SMEAR  12/15/2016  . INFLUENZA VACCINE  12/25/2017  . TETANUS/TDAP  12/31/2025   Immunization History  Administered Date(s)  Administered  . Influenza-Unspecified 02/26/2014  . Tdap 01/01/2016   Past Surgical History:  Procedure Laterality Date  . HERNIA REPAIR  1986  . TOOTH EXTRACTION  2002   Family History  Problem Relation Age of Onset  . Hypertension Mother    Social History   Tobacco Use  . Smoking status: Never Smoker  . Smokeless tobacco: Never Used  Substance Use Topics  . Alcohol use: Yes    Comment: seldom  . Drug use: No    ROS Constitutional: Denies fever, chills, weight loss/gain, headaches, insomnia,  night sweats, and change in appetite. Does c/o fatigue. Eyes: Denies redness, blurred vision, diplopia, discharge, itchy, watery eyes.  ENT: Denies discharge, congestion, post nasal drip, epistaxis, sore throat, earache, hearing loss, dental pain, Tinnitus, Vertigo, Sinus pain, snoring.  Cardio: Denies chest pain, palpitations, irregular heartbeat, syncope, dyspnea, diaphoresis, orthopnea, PND, claudication, edema Respiratory: denies cough, dyspnea, DOE, pleurisy, hoarseness, laryngitis, wheezing.  Gastrointestinal: Denies dysphagia, heartburn, reflux, water brash, pain, cramps, nausea, vomiting, bloating, diarrhea, constipation, hematemesis, melena, hematochezia, jaundice, hemorrhoids Genitourinary: Denies dysuria, frequency, urgency, nocturia, hesitancy, discharge, hematuria, flank pain Breast: Breast lumps, nipple discharge, bleeding.  Musculoskeletal: Denies arthralgia, myalgia, stiffness, Jt. Swelling, pain, limp, and strain/sprain. Denies falls. Skin: Denies puritis, rash, hives, warts, acne, eczema, changing in skin lesion Neuro: No weakness, tremor, incoordination, spasms, paresthesia, pain Psychiatric: Denies confusion, memory loss, sensory loss. Denies Depression. Endocrine: Denies change in weight, skin, hair change, nocturia, and paresthesia, diabetic polys, visual blurring, hyper /  hypo glycemic episodes.  Heme/Lymph: No excessive bleeding, bruising, enlarged lymph  nodes.  Physical Exam  BP 110/68   Pulse 60   Temp (!) 97 F (36.1 C)   Resp 16   Ht  (1.626 m)   Wt 148 lb 3.2 oz (67.2 kg)   BMI 25.44 kg/m   General Appearance: Well nourished, well groomed and in no apparent distress.  Eyes: PERRLA, EOMs, conjunctiva no swelling or erythema, normal fundi and vessels. Sinuses: No frontal/maxillary tenderness ENT/Mouth: EACs patent / TMs  nl. Nares clear without erythema, swelling, mucoid exudates. Oral hygiene is good. No erythema, swelling, or exudate. Tongue normal, non-obstructing. Tonsils not swollen or erythematous. Hearing normal.  Neck: Supple, thyroid not palpable. No bruits, nodes or JVD. Respiratory: Respiratory effort normal.  BS equal and clear bilateral without rales, rhonci, wheezing or stridor. Cardio: Heart sounds are normal with regular rate and rhythm and no murmurs, rubs or gallops. Peripheral pulses are normal and equal bilaterally without edema. No aortic or femoral bruits. Chest: symmetric with normal excursions and percussion. Breasts: Deferred to GYN Abdomen: Flat, soft with bowel sounds active. Nontender, no guarding, rebound, hernias, masses, or organomegaly.  Lymphatics: Non tender without lymphadenopathy.  Musculoskeletal: Full ROM all peripheral extremities, joint stability, 5/5 strength, and normal gait. Skin: Warm and dry without rashes, lesions, cyanosis, clubbing or  ecchymosis.  Neuro: Cranial nerves intact, reflexes equal bilaterally. Normal muscle tone, no cerebellar symptoms. Sensation intact.  Pysch: Alert and oriented X 3, normal affect, Insight and Judgment appropriate.   Assessment and Plan  1. Annual Preventative Screening Examination  2. Hypertension screen  - CBC with Differential/Platelet - COMPLETE METABOLIC PANEL WITH GFR - Magnesium - TSH - EKG 12-Lead  3. Prediabetes  - Hemoglobin A1c - Insulin, random  4. Vitamin D deficiency  - VITAMIN D 25 Hydroxyl  5. B12 deficiency  -  Vitamin B12  6. Screening for ischemic heart disease  - EKG 12-Lead  7. Screening for colorectal cancer  - POC Hemoccult Bld/Stl  8. Fatigue, unspecified type  - Iron,Total/Total Iron Binding Cap - Vitamin B12 - CBC with Differential/Platelet  9. Medication management  - CBC with Differential/Platelet - COMPLETE METABOLIC PANEL WITH GFR - Magnesium - TSH - Hemoglobin A1c - Insulin, random - VITAMIN D 25 Hydroxyl          Patient was counseled in prudent diet to achieve/maintain BMI less than 25 for weight control, BP monitoring, regular exercise and medications. Discussed med's effects and SE's. Screening labs and tests as requested with regular follow-up as recommended. Over 40 minutes of exam, counseling, chart review and high complex critical decision making was performed.

## 2017-10-23 NOTE — Patient Instructions (Addendum)
Preventive Care for Adults  A healthy lifestyle and preventive care can promote health and wellness. Preventive health guidelines for women include the following key practices.  A routine yearly physical is a good way to check with your health care provider about your health and preventive screening. It is a chance to share any concerns and updates on your health and to receive a thorough exam.  Visit your dentist for a routine exam and preventive care every 6 months. Brush your teeth twice a day and floss once a day. Good oral hygiene prevents tooth decay and gum disease.  The frequency of eye exams is based on your age, health, family medical history, use of contact lenses, and other factors. Follow your health care provider's recommendations for frequency of eye exams.  Eat a healthy diet. Foods like vegetables, fruits, whole grains, low-fat dairy products, and lean protein foods contain the nutrients you need without too many calories. Decrease your intake of foods high in solid fats, added sugars, and salt. Eat the right amount of calories for you.Get information about a proper diet from your health care provider, if necessary.  Regular physical exercise is one of the most important things you can do for your health. Most adults should get at least 150 minutes of moderate-intensity exercise (any activity that increases your heart rate and causes you to sweat) each week. In addition, most adults need muscle-strengthening exercises on 2 or more days a week.  Maintain a healthy weight. The body mass index (BMI) is a screening tool to identify possible weight problems. It provides an estimate of body fat based on height and weight. Your health care provider can find your BMI and can help you achieve or maintain a healthy weight.For adults 20 years and older:  A BMI below 18.5 is considered underweight.  A BMI of 18.5 to 24.9 is normal.  A BMI of 25 to 29.9 is considered overweight.  A BMI of  30 and above is considered obese.  Maintain normal blood lipids and cholesterol levels by exercising and minimizing your intake of saturated fat. Eat a balanced diet with plenty of fruit and vegetables. Blood tests for lipids and cholesterol should begin at age 20 and be repeated every 5 years. If your lipid or cholesterol levels are high, you are over 50, or you are at high risk for heart disease, you may need your cholesterol levels checked more frequently.Ongoing high lipid and cholesterol levels should be treated with medicines if diet and exercise are not working.  If you smoke, find out from your health care provider how to quit. If you do not use tobacco, do not start.  Lung cancer screening is recommended for adults aged 55-80 years who are at high risk for developing lung cancer because of a history of smoking. A yearly low-dose CT scan of the lungs is recommended for people who have at least a 30-pack-year history of smoking and are a current smoker or have quit within the past 15 years. A pack year of smoking is smoking an average of 1 pack of cigarettes a day for 1 year (for example: 1 pack a day for 30 years or 2 packs a day for 15 years). Yearly screening should continue until the smoker has stopped smoking for at least 15 years. Yearly screening should be stopped for people who develop a health problem that would prevent them from having lung cancer treatment.  If you are pregnant, do not drink alcohol. If you are   breastfeeding, be very cautious about drinking alcohol. If you are not pregnant and choose to drink alcohol, do not have more than 1 drink per day. One drink is considered to be 12 ounces (355 mL) of beer, 5 ounces (148 mL) of wine, or 1.5 ounces (44 mL) of liquor.  Avoid use of street drugs. Do not share needles with anyone. Ask for help if you need support or instructions about stopping the use of drugs.  High blood pressure causes heart disease and increases the risk of  stroke. Your blood pressure should be checked at least every 1 to 2 years. Ongoing high blood pressure should be treated with medicines if weight loss and exercise do not work.  If you are 3-31 years old, ask your health care provider if you should take aspirin to prevent strokes.  Diabetes screening involves taking a blood sample to check your fasting blood sugar level. This should be done once every 3 years, after age 31, if you are within normal weight and without risk factors for diabetes. Testing should be considered at a younger age or be carried out more frequently if you are overweight and have at least 1 risk factor for diabetes.  Breast cancer screening is essential preventive care for women. You should practice "breast self-awareness." This means understanding the normal appearance and feel of your breasts and may include breast self-examination. Any changes detected, no matter how small, should be reported to a health care provider. Women in their 76s and 30s should have a clinical breast exam (CBE) by a health care provider as part of a regular health exam every 1 to 3 years. After age 65, women should have a CBE every year. Starting at age 67, women should consider having a mammogram (breast X-ray test) every year. Women who have a family history of breast cancer should talk to their health care provider about genetic screening. Women at a high risk of breast cancer should talk to their health care providers about having an MRI and a mammogram every year.  Breast cancer gene (BRCA)-related cancer risk assessment is recommended for women who have family members with BRCA-related cancers. BRCA-related cancers include breast, ovarian, tubal, and peritoneal cancers. Having family members with these cancers may be associated with an increased risk for harmful changes (mutations) in the breast cancer genes BRCA1 and BRCA2. Results of the assessment will determine the need for genetic counseling and  BRCA1 and BRCA2 testing.  Routine pelvic exams to screen for cancer are no longer recommended for nonpregnant women who are considered low risk for cancer of the pelvic organs (ovaries, uterus, and vagina) and who do not have symptoms. Ask your health care provider if a screening pelvic exam is right for you.  If you have had past treatment for cervical cancer or a condition that could lead to cancer, you need Pap tests and screening for cancer for at least 20 years after your treatment. If Pap tests have been discontinued, your risk factors (such as having a new sexual partner) need to be reassessed to determine if screening should be resumed. Some women have medical problems that increase the chance of getting cervical cancer. In these cases, your health care provider may recommend more frequent screening and Pap tests.  The HPV test is an additional test that may be used for cervical cancer screening. The HPV test looks for the virus that can cause the cell changes on the cervix. The cells collected during the Pap test can  be tested for HPV. The HPV test could be used to screen women aged 98 years and older, and should be used in women of any age who have unclear Pap test results. After the age of 59, women should have HPV testing at the same frequency as a Pap test.  Colorectal cancer can be detected and often prevented. Most routine colorectal cancer screening begins at the age of 35 years and continues through age 61 years. However, your health care provider may recommend screening at an earlier age if you have risk factors for colon cancer. On a yearly basis, your health care provider may provide home test kits to check for hidden blood in the stool. Use of a small camera at the end of a tube, to directly examine the colon (sigmoidoscopy or colonoscopy), can detect the earliest forms of colorectal cancer. Talk to your health care provider about this at age 18, when routine screening begins. Direct  exam of the colon should be repeated every 5-10 years through age 67 years, unless early forms of pre-cancerous polyps or small growths are found.  People who are at an increased risk for hepatitis B should be screened for this virus. You are considered at high risk for hepatitis B if:  You were born in a country where hepatitis B occurs often. Talk with your health care provider about which countries are considered high risk.  Your parents were born in a high-risk country and you have not received a shot to protect against hepatitis B (hepatitis B vaccine).  You have HIV or AIDS.  You use needles to inject street drugs.  You live with, or have sex with, someone who has hepatitis B.  You get hemodialysis treatment.  You take certain medicines for conditions like cancer, organ transplantation, and autoimmune conditions.  Hepatitis C blood testing is recommended for all people born from [birth year] through 1965 and any individual with known risks for hepatitis C.  Practice safe sex. Use condoms and avoid high-risk sexual practices to reduce the spread of sexually transmitted infections (STIs). STIs include gonorrhea, chlamydia, syphilis, trichomonas, herpes, HPV, and human immunodeficiency virus (HIV). Herpes, HIV, and HPV are viral illnesses that have no cure. They can result in disability, cancer, and death.  You should be screened for sexually transmitted illnesses (STIs) including gonorrhea and chlamydia if:  You are sexually active and are younger than 24 years.  You are older than 24 years and your health care provider tells you that you are at risk for this type of infection.  Your sexual activity has changed since you were last screened and you are at an increased risk for chlamydia or gonorrhea. Ask your health care provider if you are at risk.  If you are at risk of being infected with HIV, it is recommended that you take a prescription medicine daily to prevent HIV infection. This is  called preexposure prophylaxis (PrEP). You are considered at risk if:  You are a heterosexual woman, are sexually active, and are at increased risk for HIV infection.  You take drugs by injection.  You are sexually active with a partner who has HIV.  Talk with your health care provider about whether you are at high risk of being infected with HIV. If you choose to begin PrEP, you should first be tested for HIV. You should then be tested every 3 months for as long as you are taking PrEP.  Osteoporosis is a disease in which the bones lose minerals and  strength with aging. This can result in serious bone fractures or breaks. The risk of osteoporosis can be identified using a bone density scan. Women ages 48 years and over and women at risk for fractures or osteoporosis should discuss screening with their health care providers. Ask your health care provider whether you should take a calcium supplement or vitamin D to reduce the rate of osteoporosis.  Menopause can be associated with physical symptoms and risks. Hormone replacement therapy is available to decrease symptoms and risks. You should talk to your health care provider about whether hormone replacement therapy is right for you.  Use sunscreen. Apply sunscreen liberally and repeatedly throughout the day. You should seek shade when your shadow is shorter than you. Protect yourself by wearing long sleeves, pants, a wide-brimmed hat, and sunglasses year round, whenever you are outdoors.  Once a month, do a whole body skin exam, using a mirror to look at the skin on your back. Tell your health care provider of new moles, moles that have irregular borders, moles that are larger than a pencil eraser, or moles that have changed in shape or color.  Stay current with required vaccines (immunizations).  Influenza vaccine. All adults should be immunized every year.  Tetanus, diphtheria, and acellular pertussis (Td, Tdap) vaccine. Pregnant women should  receive 1 dose of Tdap vaccine during each pregnancy. The dose should be obtained regardless of the length of time since the last dose. Immunization is preferred during the 27th-36th week of gestation. An adult who has not previously received Tdap or who does not know her vaccine status should receive 1 dose of Tdap. This initial dose should be followed by tetanus and diphtheria toxoids (Td) booster doses every 10 years. Adults with an unknown or incomplete history of completing a 3-dose immunization series with Td-containing vaccines should begin or complete a primary immunization series including a Tdap dose. Adults should receive a Td booster every 10 years.  Varicella vaccine. An adult without evidence of immunity to varicella should receive 2 doses or a second dose if she has previously received 1 dose. Pregnant females who do not have evidence of immunity should receive the first dose after pregnancy. This first dose should be obtained before leaving the health care facility. The second dose should be obtained 4-8 weeks after the first dose.  Human papillomavirus (HPV) vaccine. Females aged 13-26 years who have not received the vaccine previously should obtain the 3-dose series. The vaccine is not recommended for use in pregnant females. However, pregnancy testing is not needed before receiving a dose. If a female is found to be pregnant after receiving a dose, no treatment is needed. In that case, the remaining doses should be delayed until after the pregnancy. Immunization is recommended for any person with an immunocompromised condition through the age of 48 years if she did not get any or all doses earlier. During the 3-dose series, the second dose should be obtained 4-8 weeks after the first dose. The third dose should be obtained 24 weeks after the first dose and 16 weeks after the second dose.  Zoster vaccine. One dose is recommended for adults aged 67 years or older unless certain conditions are  present.  Measles, mumps, and rubella (MMR) vaccine. Adults born before [birth year] generally are considered immune to measles and mumps. Adults born in [birth year] or later should have 1 or more doses of MMR vaccine unless there is a contraindication to the vaccine or there is laboratory evidence of immunity  to each of the three diseases. A routine second dose of MMR vaccine should be obtained at least 28 days after the first dose for students attending postsecondary schools, health care workers, or international travelers. People who received inactivated measles vaccine or an unknown type of measles vaccine during 1963-1967 should receive 2 doses of MMR vaccine. People who received inactivated mumps vaccine or an unknown type of mumps vaccine before 1979 and are at high risk for mumps infection should consider immunization with 2 doses of MMR vaccine. For females of childbearing age, rubella immunity should be determined. If there is no evidence of immunity, females who are not pregnant should be vaccinated. If there is no evidence of immunity, females who are pregnant should delay immunization until after pregnancy. Unvaccinated health care workers born before [birth year] who lack laboratory evidence of measles, mumps, or rubella immunity or laboratory confirmation of disease should consider measles and mumps immunization with 2 doses of MMR vaccine or rubella immunization with 1 dose of MMR vaccine.  Pneumococcal 13-valent conjugate (PCV13) vaccine. When indicated, a person who is uncertain of her immunization history and has no record of immunization should receive the PCV13 vaccine. An adult aged 58 years or older who has certain medical conditions and has not been previously immunized should receive 1 dose of PCV13 vaccine. This PCV13 should be followed with a dose of pneumococcal polysaccharide (PPSV23) vaccine. The PPSV23 vaccine dose should be obtained at least 8 weeks after the dose of PCV13 vaccine. An adult aged 65  years or older who has certain medical conditions and previously received 1 or more doses of PPSV23 vaccine should receive 1 dose of PCV13. The PCV13 vaccine dose should be obtained 1 or more years after the last PPSV23 vaccine dose.  Pneumococcal polysaccharide (PPSV23) vaccine. When PCV13 is also indicated, PCV13 should be obtained first. All adults aged 18 years and older should be immunized. An adult younger than age 74 years who has certain medical conditions should be immunized. Any person who resides in a nursing home or long-term care facility should be immunized. An adult smoker should be immunized. People with an immunocompromised condition and certain other conditions should receive both PCV13 and PPSV23 vaccines. People with human immunodeficiency virus (HIV) infection should be immunized as soon as possible after diagnosis. Immunization during chemotherapy or radiation therapy should be avoided. Routine use of PPSV23 vaccine is not recommended for American Indians, [place] Natives, or people younger than 65 years unless there are medical conditions that require PPSV23 vaccine. When indicated, people who have unknown immunization and have no record of immunization should receive PPSV23 vaccine. One-time revaccination 5 years after the first dose of PPSV23 is recommended for people aged 19-64 years who have chronic kidney failure, nephrotic syndrome, asplenia, or immunocompromised conditions. People who received 1-2 doses of PPSV23 before age 21 years should receive another dose of PPSV23 vaccine at age 42 years or later if at least 5 years have passed since the previous dose. Doses of PPSV23 are not needed for people immunized with PPSV23 at or after age 61 years.  Meningococcal vaccine. Adults with asplenia or persistent complement component deficiencies should receive 2 doses of quadrivalent meningococcal conjugate (MenACWY-D) vaccine. The doses should be obtained at least 2 months apart.  Microbiologists working with certain meningococcal bacteria, [employer], people at risk during an outbreak, and people who travel to or live in countries with a high rate of meningitis should be immunized. A first-year college student up through  age 23 years who is living in a residence hall should receive a dose if she did not receive a dose on or after her 16th birthday. Adults who have certain high-risk conditions should receive one or more doses of vaccine.  Hepatitis A vaccine. Adults who wish to be protected from this disease, have certain high-risk conditions, work with hepatitis A-infected animals, work in hepatitis A research labs, or travel to or work in countries with a high rate of hepatitis A should be immunized. Adults who were previously unvaccinated and who anticipate close contact with an international adoptee during the first 60 days after arrival in the [place] from a country with a high rate of hepatitis A should be immunized.  Hepatitis B vaccine. Adults who wish to be protected from this disease, have certain high-risk conditions, may be exposed to blood or other infectious body fluids, are household contacts or sex partners of hepatitis B positive people, are clients or workers in certain care facilities, or travel to or work in countries with a high rate of hepatitis B should be immunized.  Haemophilus influenzae type b (Hib) vaccine. A previously unvaccinated person with asplenia or sickle cell disease or having a scheduled splenectomy should receive 1 dose of Hib vaccine. Regardless of previous immunization, a recipient of a hematopoietic stem cell transplant should receive a 3-dose series 6-12 months after her successful transplant. Hib vaccine is not recommended for adults with HIV infection. Preventive Services / Frequency  Ages 65 to 22 years  Blood pressure check.  Lipid and cholesterol check.  Clinical breast exam.** / Every 3 years for women in their 53s  and 35s.  BRCA-related cancer risk assessment.** / For women who have family members with a BRCA-related cancer (breast, ovarian, tubal, or peritoneal cancers).  Pap test.** / Every 2 years from ages 41 through 44. Every 3 years starting at age 64 through age 28 or 21 with a history of 3 consecutive normal Pap tests.  HPV screening.** / Every 3 years from ages 9 through ages 56 to 48 with a history of 3 consecutive normal Pap tests.  Hepatitis C blood test.** / For any individual with known risks for hepatitis C.  Skin self-exam. / Monthly.  Influenza vaccine. / Every year.  Tetanus, diphtheria, and acellular pertussis (Tdap, Td) vaccine.** / Consult your health care provider. Pregnant women should receive 1 dose of Tdap vaccine during each pregnancy. 1 dose of Td every 10 years.  Varicella vaccine.** / Consult your health care provider. Pregnant females who do not have evidence of immunity should receive the first dose after pregnancy.  HPV vaccine. / 3 doses over 6 months, if 67 and younger. The vaccine is not recommended for use in pregnant females. However, pregnancy testing is not needed before receiving a dose.  Measles, mumps, rubella (MMR) vaccine.** / You need at least 1 dose of MMR if you were born in 1957 or later. You may also need a 2nd dose. For females of childbearing age, rubella immunity should be determined. If there is no evidence of immunity, females who are not pregnant should be vaccinated. If there is no evidence of immunity, females who are pregnant should delay immunization until after pregnancy.  Pneumococcal 13-valent conjugate (PCV13) vaccine.** / Consult your health care provider.  Pneumococcal polysaccharide (PPSV23) vaccine.** / 1 to 2 doses if you smoke cigarettes or if you have certain conditions.  Meningococcal vaccine.** / 1 dose if you are age 61 to 21 years and  a first-year college student living in a residence hall, or have one of several medical  conditions, you need to get vaccinated against meningococcal disease. You may also need additional booster doses.  Hepatitis A vaccine.** / Consult your health care provider.  Hepatitis B vaccine.** / Consult your health care provider.  Haemophilus influenzae type b (Hib) vaccine.** / Consult your health care provider. ++++++++ Recommend Adult Low Dose Aspirin or  coated  Aspirin 81 mg daily  To reduce risk of Colon Cancer 20 %,  Skin Cancer 26 % ,  Melanoma 46%  and  Pancreatic cancer 60% ++++++++++++++++++ Vitamin D goal  is between 70-100.  Please make sure that you are taking your Vitamin D as directed.  It is very important as a natural anti-inflammatory  helping hair, skin, and nails, as well as reducing stroke and heart attack risk.  It helps your bones and helps with mood. It also decreases numerous cancer risks so please take it as directed.  Low Vit D is associated with a 200-300% higher risk for CANCER  and 200-300% higher risk for HEART   ATTACK  &  STROKE.   ...................................... It is also associated with higher death rate at younger ages,  autoimmune diseases like Rheumatoid arthritis, Lupus, Multiple Sclerosis.    Also many other serious conditions, like depression, Alzheimer's Dementia, infertility, muscle aches, fatigue, fibromyalgia - just to name a few. ++++++++++++++++++++ Recommend the book "The END of DIETING" by Dr Joel Fuhrman  & the book "The END of DIABETES " by Dr Joel Fuhrman At [employer] - get book & Audio CD's    Being diabetic has a  300% increased risk for heart attack, stroke, cancer, and alzheimer- type vascular dementia. It is very important that you work harder with diet by avoiding all foods that are white. Avoid white rice (brown & wild rice is OK), white potatoes (sweetpotatoes in moderation is OK), White bread or wheat bread or anything made out of white flour like bagels, donuts, rolls, buns, biscuits, cakes, pastries,  cookies, pizza crust, and pasta (made from white flour & egg whites) - vegetarian pasta or spinach or wheat pasta is OK. Multigrain breads like Arnold's or [employer], or multigrain sandwich thins or flatbreads.  Diet, exercise and weight loss can reverse and cure diabetes in the early stages.  Diet, exercise and weight loss is very important in the control and prevention of complications of diabetes which affects every system in your body, ie. Brain - dementia/stroke, eyes - glaucoma/blindness, heart - heart attack/heart failure, kidneys - dialysis, stomach - gastric paralysis, intestines - malabsorption, nerves - severe painful neuritis, circulation - gangrene & loss of a leg(s), and finally cancer and Alzheimers.    I recommend avoid fried & greasy foods,  sweets/candy, white rice (brown or wild rice or Quinoa is OK), white potatoes (sweet potatoes are OK) - anything made from white flour - bagels, doughnuts, rolls, buns, biscuits,white and wheat breads, pizza crust and traditional pasta made of white flour & egg white(vegetarian pasta or spinach or wheat pasta is OK).  Multi-grain bread is OK - like multi-grain flat bread or sandwich thins. Avoid alcohol in excess. Exercise is also important.    Eat all the vegetables you want - avoid meat, especially red meat and dairy - especially cheese.  Cheese is the most concentrated form of trans-fats which is the worst thing to clog up our arteries. Veggie cheese is OK which can be found in the fresh produce   section at [employer] or [employer] or [employer]  ++++++++++++++++++++ DASH Eating Plan  DASH stands for "Dietary Approaches to Stop Hypertension."   The DASH eating plan is a healthy eating plan that has been shown to reduce high blood pressure (hypertension). Additional health benefits may include reducing the risk of type 2 diabetes mellitus, heart disease, and stroke. The DASH eating plan may also help with weight loss. WHAT DO I NEED TO KNOW  ABOUT THE DASH EATING PLAN? For the DASH eating plan, you will follow these general guidelines:  Choose foods with a percent daily value for sodium of less than 5% (as listed on the food label).  Use salt-free seasonings or herbs instead of table salt or sea salt.  Check with your health care provider or pharmacist before using salt substitutes.  Eat lower-sodium products, often labeled as "lower sodium" or "no salt added."  Eat fresh foods.  Eat more vegetables, fruits, and low-fat dairy products.  Choose whole grains. Look for the word "whole" as the first word in the ingredient list.  Choose fish   Limit sweets, desserts, sugars, and sugary drinks.  Choose heart-healthy fats.  Eat veggie cheese   Eat more home-cooked food and less restaurant, buffet, and fast food.  Limit fried foods.  Cook foods using methods other than frying.  Limit canned vegetables. If you do use them, rinse them well to decrease the sodium.  When eating at a restaurant, ask that your food be prepared with less salt, or no salt if possible.                      WHAT FOODS CAN I EAT? Read Dr Joel Fuhrman's books on The End of Dieting & The End of Diabetes  Grains Whole grain or whole wheat bread. Brown rice. Whole grain or whole wheat pasta. Quinoa, bulgur, and whole grain cereals. Low-sodium cereals. Corn or whole wheat flour tortillas. Whole grain cornbread. Whole grain crackers. Low-sodium crackers.  Vegetables Fresh or frozen vegetables (raw, steamed, roasted, or grilled). Low-sodium or reduced-sodium tomato and vegetable juices. Low-sodium or reduced-sodium tomato sauce and paste. Low-sodium or reduced-sodium canned vegetables.   Fruits All fresh, canned (in natural juice), or frozen fruits.  Protein Products  All fish and seafood.  Dried beans, peas, or lentils. Unsalted nuts and seeds. Unsalted canned beans.  Dairy Low-fat dairy products, such as skim or 1% milk, 2% or reduced-fat  cheeses, low-fat ricotta or cottage cheese, or plain low-fat yogurt. Low-sodium or reduced-sodium cheeses.  Fats and Oils Tub margarines without trans fats. Light or reduced-fat mayonnaise and salad dressings (reduced sodium). Avocado. Safflower, olive, or canola oils. Natural peanut or almond butter.  Other Unsalted popcorn and pretzels. The items listed above may not be a complete list of recommended foods or beverages. Contact your dietitian for more options.  +++++++++++++++++++  WHAT FOODS ARE NOT RECOMMENDED? Grains/ White flour or wheat flour White bread. White pasta. White rice. Refined cornbread. Bagels and croissants. Crackers that contain trans fat.  Vegetables  Creamed or fried vegetables. Vegetables in a . Regular canned vegetables. Regular canned tomato sauce and paste. Regular tomato and vegetable juices.  Fruits Dried fruits. Canned fruit in light or heavy syrup. Fruit juice.  Meat and Other Protein Products Meat in general - RED meat & White meat.  Fatty cuts of meat. Ribs, chicken wings, all processed meats as bacon, sausage, bologna, salami, fatback, hot dogs, bratwurst and packaged luncheon meats.  Dairy Whole   or 2% milk, cream, half-and-half, and cream cheese. Whole-fat or sweetened yogurt. Full-fat cheeses or blue cheese. Non-dairy creamers and whipped toppings. Processed cheese, cheese spreads, or cheese curds.  Condiments Onion and garlic salt, seasoned salt, table salt, and sea salt. Canned and packaged gravies. Worcestershire sauce. Tartar sauce. Barbecue sauce. Teriyaki sauce. Soy sauce, including reduced sodium. Steak sauce. Fish sauce. Oyster sauce. Cocktail sauce. Horseradish. Ketchup and mustard. Meat flavorings and tenderizers. Bouillon cubes. Hot sauce. Tabasco sauce. Marinades. Taco seasonings. Relishes.  Fats and Oils Butter, stick margarine, lard, shortening and bacon fat. Coconut, palm kernel, or palm oils. Regular salad dressings.  Pickles and  olives. Salted popcorn and pretzels.  The items listed above may not be a complete list of foods and beverages to avoid.    

## 2017-10-24 LAB — CBC WITH DIFFERENTIAL/PLATELET
BASOS ABS: 22 {cells}/uL (ref 0–200)
Basophils Relative: 0.3 %
Eosinophils Absolute: 89 cells/uL (ref 15–500)
Eosinophils Relative: 1.2 %
HEMATOCRIT: 32.9 % — AB (ref 35.0–45.0)
Hemoglobin: 11.6 g/dL — ABNORMAL LOW (ref 11.7–15.5)
LYMPHS ABS: 1946 {cells}/uL (ref 850–3900)
MCH: 28.1 pg (ref 27.0–33.0)
MCHC: 35.3 g/dL (ref 32.0–36.0)
MCV: 79.7 fL — AB (ref 80.0–100.0)
MPV: 9 fL (ref 7.5–12.5)
Monocytes Relative: 7.8 %
NEUTROS PCT: 64.4 %
Neutro Abs: 4766 cells/uL (ref 1500–7800)
Platelets: 220 10*3/uL (ref 140–400)
RBC: 4.13 10*6/uL (ref 3.80–5.10)
RDW: 14.1 % (ref 11.0–15.0)
Total Lymphocyte: 26.3 %
WBC: 7.4 10*3/uL (ref 3.8–10.8)
WBCMIX: 577 {cells}/uL (ref 200–950)

## 2017-10-24 LAB — COMPLETE METABOLIC PANEL WITH GFR
AG RATIO: 1.8 (calc) (ref 1.0–2.5)
ALKALINE PHOSPHATASE (APISO): 32 U/L — AB (ref 33–115)
ALT: 11 U/L (ref 6–29)
AST: 15 U/L (ref 10–30)
Albumin: 3.9 g/dL (ref 3.6–5.1)
BUN: 13 mg/dL (ref 7–25)
CO2: 24 mmol/L (ref 20–32)
Calcium: 9 mg/dL (ref 8.6–10.2)
Chloride: 105 mmol/L (ref 98–110)
Creat: 0.63 mg/dL (ref 0.50–1.10)
GFR, Est African American: 132 mL/min/{1.73_m2} (ref >=60)
GFR, Est Non African American: 114 mL/min/{1.73_m2} (ref >=60)
GLOBULIN: 2.2 g/dL (ref 1.9–3.7)
Glucose, Bld: 81 mg/dL (ref 65–99)
POTASSIUM: 3.9 mmol/L (ref 3.5–5.3)
SODIUM: 136 mmol/L (ref 135–146)
Total Bilirubin: 0.4 mg/dL (ref 0.2–1.2)
Total Protein: 6.1 g/dL (ref 6.1–8.1)

## 2017-10-24 LAB — MAGNESIUM: Magnesium: 1.9 mg/dL (ref 1.5–2.5)

## 2017-10-24 LAB — VITAMIN B12: Vitamin B-12: 430 pg/mL (ref 200–1100)

## 2017-10-24 LAB — IRON, TOTAL/TOTAL IRON BINDING CAP
%SAT: 10 % — AB (ref 11–50)
Iron: 40 ug/dL (ref 40–190)
TIBC: 421 ug/dL (ref 250–450)

## 2017-10-24 LAB — HEMOGLOBIN A1C
EAG (MMOL/L): 6 (calc)
Hgb A1c MFr Bld: 5.4 % of total Hgb (ref <5.7)
Mean Plasma Glucose: 108 (calc)

## 2017-10-24 LAB — INSULIN, RANDOM: INSULIN: 2.1 u[IU]/mL (ref 2.0–19.6)

## 2017-10-24 LAB — TSH: TSH: 3.02 mIU/L

## 2017-10-24 LAB — VITAMIN D 25 HYDROXY (VIT D DEFICIENCY, FRACTURES): Vit D, 25-Hydroxy: 39 ng/mL (ref 30–100)

## 2017-10-25 ENCOUNTER — Encounter: Payer: Self-pay | Admitting: Internal Medicine

## 2017-11-11 DIAGNOSIS — Z3A16 16 weeks gestation of pregnancy: Secondary | ICD-10-CM | POA: Diagnosis not present

## 2017-11-11 DIAGNOSIS — O09512 Supervision of elderly primigravida, second trimester: Secondary | ICD-10-CM | POA: Diagnosis not present

## 2017-12-16 DIAGNOSIS — O09512 Supervision of elderly primigravida, second trimester: Secondary | ICD-10-CM | POA: Diagnosis not present

## 2017-12-16 DIAGNOSIS — Z363 Encounter for antenatal screening for malformations: Secondary | ICD-10-CM | POA: Diagnosis not present

## 2017-12-16 DIAGNOSIS — R600 Localized edema: Secondary | ICD-10-CM | POA: Diagnosis not present

## 2017-12-16 DIAGNOSIS — O321XX0 Maternal care for breech presentation, not applicable or unspecified: Secondary | ICD-10-CM | POA: Diagnosis not present

## 2017-12-16 DIAGNOSIS — Z3A21 21 weeks gestation of pregnancy: Secondary | ICD-10-CM | POA: Diagnosis not present

## 2017-12-16 DIAGNOSIS — O09519 Supervision of elderly primigravida, unspecified trimester: Secondary | ICD-10-CM | POA: Diagnosis not present

## 2017-12-16 DIAGNOSIS — Z3402 Encounter for supervision of normal first pregnancy, second trimester: Secondary | ICD-10-CM | POA: Diagnosis not present

## 2017-12-23 DIAGNOSIS — Z3A22 22 weeks gestation of pregnancy: Secondary | ICD-10-CM | POA: Diagnosis not present

## 2017-12-23 DIAGNOSIS — Z3689 Encounter for other specified antenatal screening: Secondary | ICD-10-CM | POA: Diagnosis not present

## 2018-01-06 DIAGNOSIS — Z3403 Encounter for supervision of normal first pregnancy, third trimester: Secondary | ICD-10-CM | POA: Diagnosis not present

## 2018-01-06 DIAGNOSIS — Z3A30 30 weeks gestation of pregnancy: Secondary | ICD-10-CM | POA: Diagnosis not present

## 2018-01-06 DIAGNOSIS — Z3689 Encounter for other specified antenatal screening: Secondary | ICD-10-CM | POA: Diagnosis not present

## 2018-01-06 DIAGNOSIS — Z3A37 37 weeks gestation of pregnancy: Secondary | ICD-10-CM | POA: Diagnosis not present

## 2018-01-29 ENCOUNTER — Other Ambulatory Visit: Payer: Self-pay

## 2018-02-17 DIAGNOSIS — Z3689 Encounter for other specified antenatal screening: Secondary | ICD-10-CM | POA: Diagnosis not present

## 2018-02-17 DIAGNOSIS — Z3403 Encounter for supervision of normal first pregnancy, third trimester: Secondary | ICD-10-CM | POA: Diagnosis not present

## 2018-02-17 DIAGNOSIS — Z3A30 30 weeks gestation of pregnancy: Secondary | ICD-10-CM | POA: Diagnosis not present

## 2018-02-27 ENCOUNTER — Other Ambulatory Visit: Payer: Self-pay | Admitting: Internal Medicine

## 2018-02-27 ENCOUNTER — Other Ambulatory Visit: Payer: 59

## 2018-02-27 DIAGNOSIS — Z79899 Other long term (current) drug therapy: Secondary | ICD-10-CM | POA: Diagnosis not present

## 2018-02-27 DIAGNOSIS — M791 Myalgia, unspecified site: Secondary | ICD-10-CM

## 2018-02-27 DIAGNOSIS — D509 Iron deficiency anemia, unspecified: Secondary | ICD-10-CM

## 2018-02-27 DIAGNOSIS — E559 Vitamin D deficiency, unspecified: Secondary | ICD-10-CM

## 2018-02-28 LAB — CBC WITH DIFFERENTIAL/PLATELET
Basophils Absolute: 41 cells/uL (ref 0–200)
Basophils Relative: 0.4 %
EOS ABS: 92 {cells}/uL (ref 15–500)
Eosinophils Relative: 0.9 %
HEMATOCRIT: 33.5 % — AB (ref 35.0–45.0)
HEMOGLOBIN: 11.7 g/dL (ref 11.7–15.5)
LYMPHS ABS: 2009 {cells}/uL (ref 850–3900)
MCH: 28.8 pg (ref 27.0–33.0)
MCHC: 34.9 g/dL (ref 32.0–36.0)
MCV: 82.5 fL (ref 80.0–100.0)
MPV: 8.9 fL (ref 7.5–12.5)
Monocytes Relative: 6.5 %
Neutro Abs: 7395 cells/uL (ref 1500–7800)
Neutrophils Relative %: 72.5 %
Platelets: 200 10*3/uL (ref 140–400)
RBC: 4.06 10*6/uL (ref 3.80–5.10)
RDW: 13.2 % (ref 11.0–15.0)
Total Lymphocyte: 19.7 %
WBC: 10.2 10*3/uL (ref 3.8–10.8)
WBCMIX: 663 {cells}/uL (ref 200–950)

## 2018-02-28 LAB — FERRITIN: FERRITIN: 7 ng/mL — AB (ref 16–154)

## 2018-02-28 LAB — IRON, TOTAL/TOTAL IRON BINDING CAP
%SAT: 7 % (calc) — ABNORMAL LOW (ref 16–45)
IRON: 40 ug/dL (ref 40–190)
TIBC: 534 mcg/dL (calc) — ABNORMAL HIGH (ref 250–450)

## 2018-02-28 LAB — MAGNESIUM: MAGNESIUM: 1.9 mg/dL (ref 1.5–2.5)

## 2018-02-28 LAB — VITAMIN D 25 HYDROXY (VIT D DEFICIENCY, FRACTURES): Vit D, 25-Hydroxy: 35 ng/mL (ref 30–100)

## 2018-03-04 ENCOUNTER — Other Ambulatory Visit (HOSPITAL_COMMUNITY): Payer: Self-pay

## 2018-03-04 DIAGNOSIS — Z3689 Encounter for other specified antenatal screening: Secondary | ICD-10-CM

## 2018-03-04 DIAGNOSIS — Z3A32 32 weeks gestation of pregnancy: Secondary | ICD-10-CM

## 2018-03-11 ENCOUNTER — Other Ambulatory Visit (HOSPITAL_COMMUNITY): Payer: Self-pay

## 2018-03-11 ENCOUNTER — Ambulatory Visit (HOSPITAL_COMMUNITY)
Admission: RE | Admit: 2018-03-11 | Discharge: 2018-03-11 | Disposition: A | Discharge status: Home / Self Care | Payer: 59 | Source: Ambulatory Visit

## 2018-03-11 DIAGNOSIS — Z3689 Encounter for other specified antenatal screening: Secondary | ICD-10-CM

## 2018-03-11 DIAGNOSIS — O26843 Uterine size-date discrepancy, third trimester: Secondary | ICD-10-CM

## 2018-03-11 DIAGNOSIS — Z3A32 32 weeks gestation of pregnancy: Secondary | ICD-10-CM

## 2018-04-07 DIAGNOSIS — Z3483 Encounter for supervision of other normal pregnancy, third trimester: Secondary | ICD-10-CM | POA: Diagnosis not present

## 2018-04-07 DIAGNOSIS — Z3482 Encounter for supervision of other normal pregnancy, second trimester: Secondary | ICD-10-CM | POA: Diagnosis not present

## 2018-04-08 ENCOUNTER — Inpatient Hospital Stay (HOSPITAL_COMMUNITY)
Admission: AD | Admit: 2018-04-08 | Discharge: 2018-04-09 | DRG: 788 | Disposition: A | Discharge status: Home / Self Care | Payer: 59 | Attending: Obstetrics and Gynecology | Admitting: Obstetrics and Gynecology

## 2018-04-08 ENCOUNTER — Inpatient Hospital Stay (HOSPITAL_COMMUNITY): Payer: 59 | Admitting: Certified Registered Nurse Anesthetist

## 2018-04-08 ENCOUNTER — Encounter (HOSPITAL_COMMUNITY)
Admission: AD | Disposition: A | Discharge status: Home / Self Care | Payer: Self-pay | Source: Home / Self Care | Attending: Obstetrics and Gynecology

## 2018-04-08 ENCOUNTER — Encounter (HOSPITAL_COMMUNITY): Payer: Self-pay | Admitting: *Deleted

## 2018-04-08 DIAGNOSIS — O99824 Streptococcus B carrier state complicating childbirth: Secondary | ICD-10-CM | POA: Diagnosis present

## 2018-04-08 DIAGNOSIS — Z3A37 37 weeks gestation of pregnancy: Secondary | ICD-10-CM | POA: Diagnosis not present

## 2018-04-08 DIAGNOSIS — O321XX0 Maternal care for breech presentation, not applicable or unspecified: Principal | ICD-10-CM | POA: Diagnosis present

## 2018-04-08 DIAGNOSIS — O321XX1 Maternal care for breech presentation, fetus 1: Secondary | ICD-10-CM | POA: Diagnosis not present

## 2018-04-08 DIAGNOSIS — O4292 Full-term premature rupture of membranes, unspecified as to length of time between rupture and onset of labor: Secondary | ICD-10-CM | POA: Diagnosis present

## 2018-04-08 LAB — CBC
HCT: 36.1 % (ref 36.0–46.0)
Hemoglobin: 12.5 g/dL (ref 12.0–15.0)
MCH: 29.1 pg (ref 26.0–34.0)
MCHC: 34.6 g/dL (ref 30.0–36.0)
MCV: 84.1 fL (ref 80.0–100.0)
Platelets: 209 10*3/uL (ref 150–400)
RBC: 4.29 MIL/uL (ref 3.87–5.11)
RDW: 13.2 % (ref 11.5–15.5)
WBC: 10.5 10*3/uL (ref 4.0–10.5)
nRBC: 0 % (ref 0.0–0.2)

## 2018-04-08 SURGERY — Surgical Case
Anesthesia: Regional

## 2018-04-08 MED ORDER — BUPIVACAINE HCL (PF) 0.25 % IJ SOLN
INTRAMUSCULAR | Status: AC
  Filled 2018-04-08: qty 20

## 2018-04-08 MED ORDER — OXYTOCIN 10 UNIT/ML IJ SOLN
INTRAMUSCULAR | Status: AC
  Filled 2018-04-08: qty 4

## 2018-04-08 MED ORDER — IBUPROFEN 600 MG PO TABS
600.0000 mg | ORAL_TABLET | Freq: Four times a day (QID) | ORAL | Status: DC
  Administered 2018-04-08 – 2018-04-09 (×4): 600 mg via ORAL
  Filled 2018-04-08 (×4): qty 1

## 2018-04-08 MED ORDER — BUPIVACAINE IN DEXTROSE 0.75-8.25 % IT SOLN
INTRATHECAL | Status: DC | PRN
  Administered 2018-04-08: 1.6 mL via INTRATHECAL

## 2018-04-08 MED ORDER — WITCH HAZEL-GLYCERIN EX PADS: 1.0000 "application " | MEDICATED_PAD | CUTANEOUS | Status: DC | PRN

## 2018-04-08 MED ORDER — DEXAMETHASONE SODIUM PHOSPHATE 4 MG/ML IJ SOLN
INTRAMUSCULAR | Status: DC | PRN
  Administered 2018-04-08: 4 mg via INTRAVENOUS

## 2018-04-08 MED ORDER — SODIUM CHLORIDE 0.9% FLUSH: 3.0000 mL | INTRAVENOUS | Status: DC | PRN

## 2018-04-08 MED ORDER — METHYLERGONOVINE MALEATE 0.2 MG/ML IJ SOLN: 0.2000 mg | INTRAMUSCULAR | Status: DC | PRN

## 2018-04-08 MED ORDER — NALBUPHINE HCL 10 MG/ML IJ SOLN: 5.0000 mg | INTRAMUSCULAR | Status: DC | PRN

## 2018-04-08 MED ORDER — SCOPOLAMINE 1 MG/3DAYS TD PT72
MEDICATED_PATCH | TRANSDERMAL | Status: DC | PRN
  Administered 2018-04-08: 1 via TRANSDERMAL

## 2018-04-08 MED ORDER — BUPIVACAINE HCL (PF) 0.25 % IJ SOLN
INTRAMUSCULAR | Status: AC
  Filled 2018-04-08: qty 10

## 2018-04-08 MED ORDER — DIPHENHYDRAMINE HCL 25 MG PO CAPS
25.0000 mg | ORAL_CAPSULE | ORAL | Status: DC | PRN
  Filled 2018-04-08: qty 1

## 2018-04-08 MED ORDER — PRENATAL MULTIVITAMIN CH
1.0000 | ORAL_TABLET | Freq: Every day | ORAL | Status: DC
  Administered 2018-04-09: 1 via ORAL
  Filled 2018-04-08: qty 1

## 2018-04-08 MED ORDER — NALOXONE HCL 0.4 MG/ML IJ SOLN: 0.4000 mg | INTRAMUSCULAR | Status: DC | PRN

## 2018-04-08 MED ORDER — ONDANSETRON HCL 4 MG/2ML IJ SOLN
INTRAMUSCULAR | Status: DC | PRN
  Administered 2018-04-08: 4 mg via INTRAVENOUS

## 2018-04-08 MED ORDER — FENTANYL CITRATE (PF) 100 MCG/2ML IJ SOLN
INTRAMUSCULAR | Status: AC
  Filled 2018-04-08: qty 2

## 2018-04-08 MED ORDER — METHYLERGONOVINE MALEATE 0.2 MG PO TABS: 0.2000 mg | ORAL_TABLET | ORAL | Status: DC | PRN

## 2018-04-08 MED ORDER — CEFAZOLIN SODIUM-DEXTROSE 2-4 GM/100ML-% IV SOLN
2.0000 g | Freq: Once | INTRAVENOUS | Status: AC
  Administered 2018-04-08: 2 g via INTRAVENOUS
  Filled 2018-04-08: qty 100

## 2018-04-08 MED ORDER — DEXAMETHASONE SODIUM PHOSPHATE 4 MG/ML IJ SOLN
INTRAMUSCULAR | Status: AC
  Filled 2018-04-08: qty 1

## 2018-04-08 MED ORDER — SCOPOLAMINE 1 MG/3DAYS TD PT72
MEDICATED_PATCH | TRANSDERMAL | Status: AC
  Filled 2018-04-08: qty 1

## 2018-04-08 MED ORDER — PHENYLEPHRINE 8 MG IN D5W 100 ML (0.08MG/ML) PREMIX OPTIME
INJECTION | INTRAVENOUS | Status: DC | PRN
  Administered 2018-04-08: 60 ug/min via INTRAVENOUS

## 2018-04-08 MED ORDER — LACTATED RINGERS IV SOLN
INTRAVENOUS | Status: DC
  Administered 2018-04-08: 12:00:00 via INTRAVENOUS

## 2018-04-08 MED ORDER — MORPHINE SULFATE (PF) 0.5 MG/ML IJ SOLN
INTRAMUSCULAR | Status: DC | PRN
  Administered 2018-04-08: .15 mg via INTRATHECAL

## 2018-04-08 MED ORDER — ONDANSETRON HCL 4 MG/2ML IJ SOLN: 4.0000 mg | Freq: Three times a day (TID) | INTRAMUSCULAR | Status: DC | PRN

## 2018-04-08 MED ORDER — NALOXONE HCL 4 MG/10ML IJ SOLN: 1.0000 ug/kg/h | INTRAVENOUS | Status: DC | PRN

## 2018-04-08 MED ORDER — NALBUPHINE HCL 10 MG/ML IJ SOLN: 5.0000 mg | Freq: Once | INTRAMUSCULAR | Status: DC | PRN

## 2018-04-08 MED ORDER — FAMOTIDINE IN NACL 20-0.9 MG/50ML-% IV SOLN
20.0000 mg | Freq: Once | INTRAVENOUS | Status: AC
  Administered 2018-04-08: 20 mg via INTRAVENOUS
  Filled 2018-04-08: qty 50

## 2018-04-08 MED ORDER — ACETAMINOPHEN 325 MG PO TABS: 650.0000 mg | ORAL_TABLET | ORAL | Status: DC | PRN

## 2018-04-08 MED ORDER — ONDANSETRON HCL 4 MG/2ML IJ SOLN
INTRAMUSCULAR | Status: AC
  Filled 2018-04-08: qty 2

## 2018-04-08 MED ORDER — BUPIVACAINE HCL (PF) 0.25 % IJ SOLN
INTRAMUSCULAR | Status: DC | PRN
  Administered 2018-04-08: 30 mL

## 2018-04-08 MED ORDER — LACTATED RINGERS IV SOLN: INTRAVENOUS | Status: DC

## 2018-04-08 MED ORDER — MEPERIDINE HCL 25 MG/ML IJ SOLN: 6.2500 mg | INTRAMUSCULAR | Status: DC | PRN

## 2018-04-08 MED ORDER — TETANUS-DIPHTH-ACELL PERTUSSIS 5-2.5-18.5 LF-MCG/0.5 IM SUSP: 0.5000 mL | Freq: Once | INTRAMUSCULAR | Status: DC

## 2018-04-08 MED ORDER — FENTANYL CITRATE (PF) 100 MCG/2ML IJ SOLN
INTRAMUSCULAR | Status: DC | PRN
  Administered 2018-04-08: 15 ug via INTRATHECAL

## 2018-04-08 MED ORDER — SIMETHICONE 80 MG PO CHEW: 80.0000 mg | CHEWABLE_TABLET | ORAL | Status: DC | PRN

## 2018-04-08 MED ORDER — SENNOSIDES-DOCUSATE SODIUM 8.6-50 MG PO TABS
2.0000 | ORAL_TABLET | ORAL | Status: DC
  Administered 2018-04-08: 2 via ORAL
  Filled 2018-04-08: qty 2

## 2018-04-08 MED ORDER — OXYCODONE-ACETAMINOPHEN 5-325 MG PO TABS: 2.0000 | ORAL_TABLET | ORAL | Status: DC | PRN

## 2018-04-08 MED ORDER — MENTHOL 3 MG MT LOZG: 1.0000 | LOZENGE | OROMUCOSAL | Status: DC | PRN

## 2018-04-08 MED ORDER — DIBUCAINE 1 % RE OINT: 1.0000 "application " | TOPICAL_OINTMENT | RECTAL | Status: DC | PRN

## 2018-04-08 MED ORDER — OXYTOCIN 40 UNITS IN LACTATED RINGERS INFUSION - SIMPLE MED: 2.5000 [IU]/h | INTRAVENOUS | Status: AC

## 2018-04-08 MED ORDER — SOD CITRATE-CITRIC ACID 500-334 MG/5ML PO SOLN
30.0000 mL | Freq: Once | ORAL | Status: AC
  Administered 2018-04-08: 30 mL via ORAL
  Filled 2018-04-08: qty 15

## 2018-04-08 MED ORDER — DIPHENHYDRAMINE HCL 50 MG/ML IJ SOLN: 12.5000 mg | INTRAMUSCULAR | Status: DC | PRN

## 2018-04-08 MED ORDER — SIMETHICONE 80 MG PO CHEW
80.0000 mg | CHEWABLE_TABLET | ORAL | Status: DC
  Administered 2018-04-08: 80 mg via ORAL
  Filled 2018-04-08: qty 1

## 2018-04-08 MED ORDER — OXYCODONE-ACETAMINOPHEN 5-325 MG PO TABS: 1.0000 | ORAL_TABLET | ORAL | Status: DC | PRN

## 2018-04-08 MED ORDER — MORPHINE SULFATE (PF) 0.5 MG/ML IJ SOLN
INTRAMUSCULAR | Status: AC
  Filled 2018-04-08: qty 10

## 2018-04-08 MED ORDER — COCONUT OIL OIL: 1.0000 "application " | TOPICAL_OIL | Status: DC | PRN

## 2018-04-08 MED ORDER — PHENYLEPHRINE 8 MG IN D5W 100 ML (0.08MG/ML) PREMIX OPTIME
INJECTION | INTRAVENOUS | Status: AC
  Filled 2018-04-08: qty 100

## 2018-04-08 MED ORDER — SODIUM CHLORIDE 0.9 % IR SOLN
Status: DC | PRN
  Administered 2018-04-08: 1

## 2018-04-08 MED ORDER — OXYTOCIN 10 UNIT/ML IJ SOLN
INTRAVENOUS | Status: DC | PRN
  Administered 2018-04-08: 40 [IU] via INTRAVENOUS

## 2018-04-08 MED ORDER — SCOPOLAMINE 1 MG/3DAYS TD PT72: 1.0000 | MEDICATED_PATCH | Freq: Once | TRANSDERMAL | Status: DC

## 2018-04-08 MED ORDER — SIMETHICONE 80 MG PO CHEW
80.0000 mg | CHEWABLE_TABLET | Freq: Three times a day (TID) | ORAL | Status: DC
  Administered 2018-04-08: 80 mg via ORAL
  Filled 2018-04-08 (×2): qty 1

## 2018-04-08 MED ORDER — LACTATED RINGERS IV SOLN
INTRAVENOUS | Status: DC | PRN
  Administered 2018-04-08 (×2): via INTRAVENOUS

## 2018-04-08 MED ORDER — GLYCOPYRROLATE 0.2 MG/ML IJ SOLN
INTRAMUSCULAR | Status: AC
  Filled 2018-04-08: qty 1

## 2018-04-08 MED ORDER — ZOLPIDEM TARTRATE 5 MG PO TABS: 5.0000 mg | ORAL_TABLET | Freq: Every evening | ORAL | Status: DC | PRN

## 2018-04-08 MED ORDER — GLYCOPYRROLATE 0.2 MG/ML IJ SOLN
INTRAMUSCULAR | Status: DC | PRN
  Administered 2018-04-08: 0.2 mg via INTRAVENOUS

## 2018-04-08 MED ORDER — DIPHENHYDRAMINE HCL 25 MG PO CAPS: 25.0000 mg | ORAL_CAPSULE | Freq: Four times a day (QID) | ORAL | Status: DC | PRN

## 2018-04-08 MED ORDER — LACTATED RINGERS IV BOLUS
1000.0000 mL | Freq: Once | INTRAVENOUS | Status: AC
  Administered 2018-04-08: 1000 mL via INTRAVENOUS

## 2018-04-08 SURGICAL SUPPLY — 36 items
CHLORAPREP W/TINT 26ML (MISCELLANEOUS) ×2 IMPLANT
CLAMP CORD UMBIL (MISCELLANEOUS) IMPLANT
CLOTH BEACON ORANGE TIMEOUT ST (SAFETY) ×2 IMPLANT
DERMABOND ADVANCED (GAUZE/BANDAGES/DRESSINGS) ×1
DERMABOND ADVANCED .7 DNX12 (GAUZE/BANDAGES/DRESSINGS) ×1 IMPLANT
DRSG OPSITE POSTOP 4X10 (GAUZE/BANDAGES/DRESSINGS) ×2 IMPLANT
DRSG OPSITE POSTOP 4X12 (GAUZE/BANDAGES/DRESSINGS) ×2 IMPLANT
ELECT REM PT RETURN 9FT ADLT (ELECTROSURGICAL) ×2
ELECTRODE REM PT RTRN 9FT ADLT (ELECTROSURGICAL) ×1 IMPLANT
EXTRACTOR VACUUM M CUP 4 TUBE (SUCTIONS) IMPLANT
GLOVE BIO SURGEON STRL SZ7.5 (GLOVE) ×2 IMPLANT
GLOVE BIOGEL PI IND STRL 7.0 (GLOVE) ×1 IMPLANT
GLOVE BIOGEL PI INDICATOR 7.0 (GLOVE) ×1
GOWN STRL REUS W/TWL LRG LVL3 (GOWN DISPOSABLE) ×4 IMPLANT
KIT ABG SYR 3ML LUER SLIP (SYRINGE) IMPLANT
NEEDLE HYPO 22GX1.5 SAFETY (NEEDLE) ×2 IMPLANT
NEEDLE HYPO 25X5/8 SAFETYGLIDE (NEEDLE) IMPLANT
NEEDLE SPNL 20GX3.5 QUINCKE YW (NEEDLE) IMPLANT
NS IRRIG 1000ML POUR BTL (IV SOLUTION) ×2 IMPLANT
PACK C SECTION WH (CUSTOM PROCEDURE TRAY) ×2 IMPLANT
PENCIL SMOKE EVAC W/HOLSTER (ELECTROSURGICAL) ×2 IMPLANT
SPONGE LAP 18X18 RF (DISPOSABLE) ×6 IMPLANT
SUT MNCRL 0 VIOLET CTX 36 (SUTURE) ×2 IMPLANT
SUT MNCRL AB 3-0 PS2 27 (SUTURE) IMPLANT
SUT MON AB 2-0 CT1 27 (SUTURE) ×2 IMPLANT
SUT MON AB-0 CT1 36 (SUTURE) ×4 IMPLANT
SUT MONOCRYL 0 CTX 36 (SUTURE) ×2
SUT PLAIN 0 NONE (SUTURE) IMPLANT
SUT PLAIN 2 0 (SUTURE)
SUT PLAIN 2 0 XLH (SUTURE) IMPLANT
SUT PLAIN ABS 2-0 CT1 27XMFL (SUTURE) IMPLANT
SUT VIC AB 4-0 KS 27 (SUTURE) ×2 IMPLANT
SYR 20CC LL (SYRINGE) IMPLANT
SYR CONTROL 10ML LL (SYRINGE) ×2 IMPLANT
TOWEL OR 17X24 6PK STRL BLUE (TOWEL DISPOSABLE) ×2 IMPLANT
TRAY FOLEY W/BAG SLVR 14FR LF (SET/KITS/TRAYS/PACK) ×2 IMPLANT

## 2018-04-08 NOTE — H&P (Signed)
  OB ADMISSION/ HISTORY & PHYSICAL:  Admission Date: 04/08/2018  9:02 AM  Admit Diagnosis: 37 weeks SROM with breech presentation  Cassidy Short is a 37 y.o. female presenting for SROM this am at 0430 - mild cramps but no labor onset or regular contraction. Clear fluid leakage - no bleeding. Last ate 7pm last night - water only this AM.  Prenatal History: G1P0   EDC : 04/27/2018, Date entered prior to episode creation  [facility] Prenatal course complicated by size<dates (MFM consult - normal fetal growth and dopplers) / persistent breech presentation since 30 weeks.  Prenatal Labs: ABO, Rh:  O positive Antibody:  negative Rubella:   Immune RPR:   NR HBsAg:   negative HIV:   NR GTT: normal 2hr GTT GBS:   POSITIVE  OB History  Gravida Para Term Preterm AB Living  1            SAB TAB Ectopic Multiple Live Births               # Outcome Date GA Lbr Len/2nd Weight Sex Delivery Anes PTL Lv  1 Current             Medical / Surgical History : Past medical history:  Past Medical History:  Diagnosis Date  . Allergy   . Anxiety   . Depression     Past surgical history:  Past Surgical History:  Procedure Laterality Date  . HERNIA REPAIR  1986  . TOOTH EXTRACTION  2002   Family History:  Family History  Problem Relation Age of Onset  . Hypertension Mother     Social History:  reports that she has never smoked. She has never used smokeless tobacco. She reports that she drank alcohol. She reports that she does not use drugs.  Allergies: Patient has no known allergies.   Current Medications at time of admission:  Prior to Admission medications   Medication Sig Start Date End Date Taking? Authorizing Provider  Multiple Vitamins-Minerals (WOMENS MULTIVITAMIN PO) Take by mouth daily.    [provider]   Review of Systems: Active FM No CTX - mild cramps LOF  / SROM @ 0430 - clear  Physical Exam: VS: Blood pressure 121/76, pulse (!)  57, temperature 97.9 F (36.6 C), temperature source Oral, resp. rate 16.  General: alert and oriented, appears calm and comfortable Heart: RRR Lungs: Clear lung fields Abdomen: Gravid, soft and non-tender, non-distended / uterus: gravid / breech by Leopalds Extremities: no edema  Genitalia / VE:  deferred  FHR: baseline rate 135 / variability moderate / accelerations + / no decelerations TOCO: rare ctx  Assessment: 37.[redacted] weeks gestation PROM with breech presentation FHR category 1  Plan:  Admit Pre-op for CS delivery  Dr Billy Coastaavon notified of admission / plan of care - CS scheduled for 12:15pm  Marlinda Mikeanya Bailey CNM, MSN, [facility] 04/08/2018, 9:40 AM

## 2018-04-08 NOTE — Op Note (Signed)
Cesarean Section Procedure Note  Indications: malpresentation: frank breech in active labor  Pre-operative Diagnosis: 37 week 2 day pregnancy.  Post-operative Diagnosis: same  Surgeon: Lenoard AdenAAVON,Avea Mcgowen J   Assistants: Renae FicklePaul, CNM  Anesthesia: Local anesthesia 0.25.% bupivacaine and Spinal anesthesia  ASA Class: 2  Procedure Details  The patient was seen in the Holding Room. The risks, benefits, complications, treatment options, and expected outcomes were discussed with the patient.  The patient concurred with the proposed plan, giving informed consent. The risks of anesthesia, infection, bleeding and possible injury to other organs discussed. Injury to bowel, bladder, or ureter with possible need for repair discussed. Possible need for transfusion with secondary risks of hepatitis or HIV acquisition discussed. Post operative complications to include but not limited to DVT, PE and Pneumonia noted. The site of surgery properly noted/marked. The patient was taken to Operating Room # 1, identified as Devin GoingRachel Interrante and the procedure verified as C-Section Delivery. A Time Out was held and the above information confirmed.  After induction of anesthesia, the patient was draped and prepped in the usual sterile manner. A Pfannenstiel incision was made and carried down through the subcutaneous tissue to the fascia. Fascial incision was made and extended transversely using Mayo scissors. The fascia was separated from the underlying rectus tissue superiorly and inferiorly. The peritoneum was identified and entered. Peritoneal incision was extended longitudinally. The utero-vesical peritoneal reflection was incised transversely and the bladder flap was bluntly freed from the lower uterine segment. A low transverse uterine incision(Kerr hysterotomy) was made. Delivered from frank breech presentation was a  female with Apgar scores of 8 at one minute and 9 at five minutes. Bulb suctioning gently performed. Neonatal team  in attendance.After the umbilical cord was clamped and cut cord blood was obtained for evaluation. The placenta was removed intact and appeared normal. The uterus was curetted with a dry lap pack. Good hemostasis was noted.The uterine outline, tubes and ovaries appeared normal. The uterine incision was closed with running locked sutures of 0 Monocryl x 2 layers. Additional interrupted ) monocryl suture at right angle for hemostasis. Hemostasis was observed. The parietal peritoneum was closed with a running 2-0 Monocryl suture. The fascia was then reapproximated with running sutures of 0 Monocryl. The skin was reapproximated with 4-0 vicryl after Sorento closure with 2-0 plain.  Instrument, sponge, and needle counts were correct prior the abdominal closure and at the conclusion of the case.   Findings: Homero FellersFrank breech, anterior placenta. Nl uterus, nl adnexa  Estimated Blood Loss:  500         Drains: foley                 Specimens: placenta                 Complications:  None; patient tolerated the procedure well.         Disposition: PACU - hemodynamically stable.         Condition: stable  Attending Attestation: I performed the procedure.

## 2018-04-08 NOTE — Anesthesia Preprocedure Evaluation (Signed)
Anesthesia Evaluation  Patient identified by MRN, date of birth, ID band Patient awake    Reviewed: Allergy & Precautions, NPO status , Patient's Chart, lab work & pertinent test results  Airway Mallampati: II  TM Distance: >3 FB Neck ROM: Full    Dental no notable dental hx. (+) Teeth Intact, Dental Advisory Given   Pulmonary neg pulmonary ROS,    Pulmonary exam normal breath sounds clear to auscultation       Cardiovascular negative cardio ROS Normal cardiovascular exam Rhythm:Regular Rate:Normal     Neuro/Psych PSYCHIATRIC DISORDERS Anxiety Depression negative neurological ROS  negative psych ROS   GI/Hepatic negative GI ROS, Neg liver ROS,   Endo/Other  negative endocrine ROS  Renal/GU negative Renal ROS  negative genitourinary   Musculoskeletal negative musculoskeletal ROS (+)   Abdominal   Peds  Hematology negative hematology ROS (+)   Anesthesia Other Findings ROM breech presentation  Reproductive/Obstetrics (+) Pregnancy                             Anesthesia Physical Anesthesia Plan  ASA: II  Anesthesia Plan: Spinal   Post-op Pain Management:    Induction:   PONV Risk Score and Plan: Treatment may vary due to age or medical condition  Airway Management Planned: Natural Airway  Additional Equipment:   Intra-op Plan:   Post-operative Plan:   Informed Consent: I have reviewed the patients History and Physical, chart, labs and discussed the procedure including the risks, benefits and alternatives for the proposed anesthesia with the patient or authorized representative who has indicated his/her understanding and acceptance.   Dental advisory given  Plan Discussed with: CRNA  Anesthesia Plan Comments:         Anesthesia Quick Evaluation

## 2018-04-08 NOTE — Anesthesia Procedure Notes (Signed)
Spinal  Patient location during procedure: OR Start time: 04/08/2018 12:30 PM End time: 04/08/2018 12:40 PM Staffing Anesthesiologist: Elmer PickerWoodrum, Chelsey L, MD Performed: anesthesiologist  Preanesthetic Checklist Completed: patient identified, surgical consent, pre-op evaluation, timeout performed, IV checked, risks and benefits discussed and monitors and equipment checked Spinal Block Patient position: sitting Prep: site prepped and draped and DuraPrep Patient monitoring: cardiac monitor, continuous pulse ox and blood pressure Approach: midline Location: L3-4 Injection technique: single-shot Needle Needle type: Pencan  Needle gauge: 24 G Needle length: 9 cm Assessment Sensory level: T6 Additional Notes Functioning IV was confirmed and monitors were applied. Sterile prep and drape, including hand hygiene and sterile gloves were used. The patient was positioned and the spine was prepped. The skin was anesthetized with lidocaine.  Free flow of clear CSF was obtained prior to injecting local anesthetic into the CSF.  The spinal needle aspirated freely following injection.  The needle was carefully withdrawn.  The patient tolerated the procedure well.

## 2018-04-08 NOTE — Anesthesia Postprocedure Evaluation (Signed)
Anesthesia Post Note  Patient: Devin GoingRachel Camus  Procedure(s) Performed: CESAREAN SECTION (N/A )     Patient location during evaluation: PACU Anesthesia Type: Spinal Level of consciousness: oriented and awake and alert Pain management: pain level controlled Vital Signs Assessment: post-procedure vital signs reviewed and stable Respiratory status: spontaneous breathing, respiratory function stable and patient connected to nasal cannula oxygen Cardiovascular status: blood pressure returned to baseline and stable Postop Assessment: no headache, no backache and no apparent nausea or vomiting Anesthetic complications: no    Last Vitals:  Vitals:   04/08/18 1620 04/08/18 1720  BP: 107/69 118/73  Pulse: 63 72  Resp: 16 17  Temp: 36.6 C 36.6 C  SpO2: 100% 99%    Last Pain:  Vitals:   04/08/18 1820  TempSrc:   PainSc: 0-No pain   Pain Goal: Patients Stated Pain Goal: 0 (04/08/18 0931)               Kandace Elrod L Shianne Zeiser

## 2018-04-08 NOTE — Transfer of Care (Signed)
Immediate Anesthesia Transfer of Care Note  Patient: Cassidy GoingRachel Short  Procedure(s) Performed: CESAREAN SECTION (N/A )  Patient Location: PACU  Anesthesia Type:Spinal  Level of Consciousness: awake, alert  and patient cooperative  Airway & Oxygen Therapy: Patient Spontanous Breathing  Post-op Assessment: Report given to RN and Post -op Vital signs reviewed and stable  Post vital signs: Reviewed and stable  Last Vitals:  Vitals Value Taken Time  BP 105/55 04/08/2018  1:45 PM  Temp    Pulse 62 04/08/2018  1:47 PM  Resp 13 04/08/2018  1:47 PM  SpO2 98 % 04/08/2018  1:47 PM  Vitals shown include unvalidated device data.  Last Pain:  Vitals:   04/08/18 1341  TempSrc: (P) Oral  PainSc: (P) 0-No pain      Patients Stated Pain Goal: 0 (04/08/18 0931)  Complications: No apparent anesthesia complications

## 2018-04-08 NOTE — MAU Note (Addendum)
Pt states her water broke @ 0430, clear fluid.  [facility].  Having mild/moderate contractions, denies bleeding.  Reports good fetal movement.  Baby is breech.

## 2018-04-08 NOTE — MAU Provider Note (Signed)
Patient seen and examined. Consent witnessed and signed. No changes noted. Update completed. BP 121/76 (BP Location: Left Arm)   Pulse (!) 57   Temp 97.9 F (36.6 C) (Oral)   Resp 16   CBC    Component Value Date/Time   WBC 10.5 04/08/2018 1020   RBC 4.29 04/08/2018 1020   HGB 12.5 04/08/2018 1020   HCT 36.1 04/08/2018 1020   PLT 209 04/08/2018 1020   MCV 84.1 04/08/2018 1020   MCH 29.1 04/08/2018 1020   MCHC 34.6 04/08/2018 1020   RDW 13.2 04/08/2018 1020   LYMPHSABS 2,009 02/27/2018 0918   MONOABS 315 01/01/2016 0940   EOSABS 92 02/27/2018 0918   BASOSABS 41 02/27/2018 0918

## 2018-04-09 LAB — CBC
HCT: 29.5 % — ABNORMAL LOW (ref 36.0–46.0)
Hemoglobin: 10.3 g/dL — ABNORMAL LOW (ref 12.0–15.0)
MCH: 29.6 pg (ref 26.0–34.0)
MCHC: 34.9 g/dL (ref 30.0–36.0)
MCV: 84.8 fL (ref 80.0–100.0)
Platelets: 182 K/uL (ref 150–400)
RBC: 3.48 MIL/uL — ABNORMAL LOW (ref 3.87–5.11)
RDW: 13.5 % (ref 11.5–15.5)
WBC: 14.4 K/uL — ABNORMAL HIGH (ref 4.0–10.5)
nRBC: 0 % (ref 0.0–0.2)

## 2018-04-09 LAB — BPAM RBC
Blood Product Expiration Date: 201912162359
Blood Product Expiration Date: 201912162359
Unit Type and Rh: 9500
Unit Type and Rh: 9500

## 2018-04-09 LAB — TYPE AND SCREEN
ABO/RH(D): O NEG
Antibody Screen: POSITIVE
Unit division: 0
Unit division: 0

## 2018-04-09 LAB — BIRTH TISSUE RECOVERY COLLECTION (PLACENTA DONATION)

## 2018-04-09 LAB — RPR: RPR Ser Ql: NONREACTIVE

## 2018-04-09 MED ORDER — IBUPROFEN 600 MG PO TABS: 600.0000 mg | ORAL_TABLET | Freq: Four times a day (QID) | ORAL | 0 refills | Status: DC

## 2018-04-09 MED ORDER — SENNOSIDES-DOCUSATE SODIUM 8.6-50 MG PO TABS: 2.0000 | ORAL_TABLET | ORAL | Status: DC

## 2018-04-09 MED ORDER — RHO D IMMUNE GLOBULIN 1500 UNIT/2ML IJ SOSY
300.0000 ug | PREFILLED_SYRINGE | Freq: Once | INTRAMUSCULAR | Status: AC
  Administered 2018-04-09: 300 ug via INTRAVENOUS
  Filled 2018-04-09: qty 2

## 2018-04-09 MED ORDER — SIMETHICONE 80 MG PO CHEW: 80.0000 mg | CHEWABLE_TABLET | ORAL | 0 refills | Status: DC | PRN

## 2018-04-09 MED ORDER — COCONUT OIL OIL: 1.0000 "application " | TOPICAL_OIL | 0 refills | Status: DC | PRN

## 2018-04-09 MED ORDER — ACETAMINOPHEN 325 MG PO TABS: 650.0000 mg | ORAL_TABLET | ORAL | Status: DC | PRN

## 2018-04-09 MED ORDER — OXYCODONE-ACETAMINOPHEN 5-325 MG PO TABS: 1.0000 | ORAL_TABLET | ORAL | 0 refills | Status: DC | PRN

## 2018-04-09 NOTE — Progress Notes (Signed)
MOB was referred for history of depression/anxiety. * Referral screened out by Clinical Social Worker because none of the following criteria appear to apply: ~ History of anxiety/depression during this pregnancy, or of post-partum depression following prior delivery. ~ Diagnosis of anxiety and/or depression within last 3 years. Per chart review, MOB hx anxiety and depression dates back to 2014.  OR * MOB's symptoms currently being treated with medication and/or therapy. Please contact the Clinical Social Worker if needs arise, by [facility] request, or if MOB scores greater than 9/yes to question 10 on Edinburgh Postpartum Depression Screen.  Celso SickleKimberly Shizuko Wojdyla, LCSWA Clinical Social Worker [facility] Cell#: [phone]

## 2018-04-09 NOTE — Discharge Summary (Signed)
OB Discharge Summary  Patient Name: Cassidy Short DOB: 03/06/1981 MRN: [id number]  Date of admission: 04/08/2018 Delivering provider: Olivia MackieAAVON, Short   Date of discharge: 04/09/2018  Admitting diagnosis: 37wks breech and rupture Intrauterine pregnancy: 8218w2d     Secondary diagnosis:Active Problems:   Breech birth  Additional problems:none      Discharge diagnosis:  Patient Active Problem List   Diagnosis Date Noted  . Breech birth 04/08/2018  . B12 deficiency 11/17/2014  . Vitamin D deficiency 11/17/2014  . Major depression in full remission (HCC)   . Allergy   . Anxiety                                                                 Post partum procedures:rhogam Pain control: Spinal  Complications: None  Hospital course:  Sceduled C/S   [name] at 5618w2d was admitted to the hospital 04/08/2018 for active labor with ROM, fetus in breech presentation. Patient counseled for urgent cesarean section with the following indication:Malpresentation.  Membrane Rupture Time/Date: 4:30 AM ,04/08/2018   Patient delivered a Viable infant.04/08/2018  Details of operation can be found in separate operative note.  [name] had an uncomplicated postpartum course.  She is ambulating, tolerating a regular diet, passing flatus, and urinating well. Patient is discharged home in stable condition on  04/09/18         Physical exam  Vitals:   04/08/18 2130 04/09/18 0130 04/09/18 0550 04/09/18 0930  BP:  (!) 112/57 107/62 114/60  Pulse:  61 62 (!) 59  Resp: 18 18 18 18   Temp: 98.6 F (37 C) 98 F (36.7 C)  98.9 F (37.2 C)  TempSrc: Oral Oral    SpO2: 98% 98% 97% 99%   General: alert, cooperative and no distress Lochia: appropriate Uterine Fundus: firm Incision: Healing well with no significant drainage DVT Evaluation: No cords or calf tenderness. No significant calf/ankle edema. Labs: Lab Results  Component Value Date   WBC 14.4 (H) 04/09/2018   HGB 10.3 (L) 04/09/2018   HCT 29.5 (L) 04/09/2018   MCV 84.8 04/09/2018   PLT 182 04/09/2018   CMP Latest Ref Rng & Units 10/23/2017  Glucose 65 - 99 mg/dL 81  BUN 7 - 25 mg/dL 13  Creatinine 2.950.50 - 6.211.10 mg/dL 3.080.63  Sodium 657135 - 846146 mmol/L 136  Potassium 3.5 - 5.3 mmol/L 3.9  Chloride 98 - 110 mmol/L 105  CO2 20 - 32 mmol/L 24  Calcium 8.6 - 10.2 mg/dL 9.0  Total Protein 6.1 - 8.1 g/dL 6.1  Total Bilirubin 0.2 - 1.2 mg/dL 0.4  Alkaline Phos 33 - 115 U/L -  AST 10 - 30 U/L 15  ALT 6 - 29 U/L 11    Vaccines: TDaP UTD         Flu    UTD  Discharge instruction: per After Visit Summary and "Baby and Me Booklet".  After Visit Meds:  Allergies as of 04/09/2018   No Known Allergies     Medication List    TAKE these medications   acetaminophen 325 MG tablet Commonly known as:  TYLENOL Take 2 tablets (650 mg total) by mouth every 4 (four) hours as needed (for pain scale < 4).   coconut oil Oil Apply 1 application  topically as needed.   ibuprofen 600 MG tablet Commonly known as:  ADVIL,MOTRIN Take 1 tablet (600 mg total) by mouth every 6 (six) hours.   magnesium oxide 400 MG tablet Commonly known as:  MAG-OX Take 800 mg by mouth daily.   oxyCODONE-acetaminophen 5-325 MG tablet Commonly known as:  PERCOCET/ROXICET Take 1 tablet by mouth every 4 (four) hours as needed (pain scale 4-7).   prenatal multivitamin Tabs tablet Take 1 tablet by mouth daily at 12 noon.   senna-docusate 8.6-50 MG tablet Commonly known as:  Senokot-S Take 2 tablets by mouth daily. Start taking on:  04/10/2018   simethicone 80 MG chewable tablet Commonly known as:  MYLICON Chew 1 tablet (80 mg total) by mouth as needed for flatulence.   vitamin B-12 500 MCG tablet Commonly known as:  CYANOCOBALAMIN Take 1,000 mcg by mouth daily.       Diet: routine diet  Activity: Advance as tolerated. Pelvic rest for 6 weeks.   Postpartum contraception: Not Discussed  Newborn Data: Live born female  Birth Weight: 6 lb 1  oz (2750 g) APGAR: 9, 9  Newborn Delivery   Birth date/time:  04/08/2018 12:54:00 Delivery type:  C-Section, Low Transverse Trial of labor:  No C-section categorization:  Primary     named Denyce Short Baby Feeding: Breast Disposition:home with mother   Delivery Report:  Review the Delivery Report for details.    Follow up: Follow-up Information    Cassidy Short, CNM. Schedule an appointment as soon as possible for a visit in 2 week(s).   Specialty:  Obstetrics and Gynecology Contact information: [address] [place] [address] [phone]             Signed: Cipriano Mile, MSN 04/09/2018, 1:37 PM

## 2018-04-09 NOTE — Progress Notes (Signed)
Subjective: POD# 1 Live born female  Birth Weight: 6 lb 1 oz (2750 g) APGAR: 9, 9  Newborn Delivery   Birth date/time:  04/[birth year]/2018 12:54:00 Delivery type:  C-Section, Low Transverse Trial of labor:  No C-section categorization:  Primary    Baby name: Cassidy Short Delivering provider: Olivia MackieAAVON, RICHARD   Feeding: breast  Pain control at delivery: Spinal   Reports feeling very well, desires early DC  Patient reports tolerating PO.   Breast symptoms: able to latch baby by herself, no pain.  Pain controlled with PO meds Denies HA/SOB/C/P/N/V/dizziness. Flatus minimal. She reports vaginal bleeding as normal, without clots.  She is ambulating, urinating without difficulty.     Objective:   VS:    Vitals:   04/08/18 2130 04/09/18 0130 04/09/18 0550 04/09/18 0930  BP:  (!) 112/57 107/62 114/60  Pulse:  61 62 (!) 59  Resp: 18 18 18 18   Temp: 98.6 F (37 C) 98 F (36.7 C)  98.9 F (37.2 C)  TempSrc: Oral Oral    SpO2: 98% 98% 97% 99%      Intake/Output Summary (Last 24 hours) at 04/09/2018 1305 Last data filed at 04/09/2018 1000 Gross per 24 hour  Intake 1200 ml  Output 2119 ml  Net -919 ml        Recent Labs    04/08/18 1020 04/09/18 0606  WBC 10.5 14.4*  HGB 12.5 10.3*  HCT 36.1 29.5*  PLT 209 182     Blood type: --/--/O NEG / NB Rh positive Performed at [facility], [address] [place], [place] [address]  [phone](11/14 0606)  Rubella:   immune Vaccines: TDaP UTD         Flu    UTD   Physical Exam:  General: alert, cooperative and no distress CV: Regular rate and rhythm Resp: clear Abdomen: soft, nontender, normal bowel sounds Incision: clean, dry and intact Uterine Fundus: firm, below umbilicus, nontender Lochia: minimal Ext: no edema, redness or tenderness in the calves or thighs      Assessment/Plan: 37 y.o.   POD# 1. G1P1001                  Active Problems:   Breech birth / 1C/S for labor/malpresentation   Doing well, stable.           Advance diet as tolerated Encourage rest when baby rests Breastfeeding support outpatient PRN Encourage to ambulate and warm fluids Routine post-op care             DC home today w/ instructions  [facility] and PRN   Neta Mendsaniela C , CNM, MSN 04/09/2018, 1:05 PM

## 2018-04-09 NOTE — Lactation Note (Signed)
This note was copied from a baby's chart. Lactation Consultation Note  Patient Name: Cassidy Short JOACZ'YToday's Date: 04/09/2018 Reason for consult: Early term 37-38.6wks;1st time breastfeeding;Infant < 6lbs LC entered room, mom hold infant STS. P1, 15 hour female infant, ETI and c/s delivery. Per mom infant had 1 void and 3 stools. She BF 10 mins less than 30 minutes ago [facility]. Per mom, she doesn't want lactation services, she attended BF class in her pregnancy and she feels BF is going well. She does not want  to be charge for lactation, nor has she asked to be seen by lactation  and she has her own DEBP at home that can be brought to the hospital.  [facility].  Maternal Data Has patient been taught Hand Expression?: Yes Does the patient have breastfeeding experience prior to this delivery?: No  Feeding    LATCH Score                   Interventions    Lactation Tools Discussed/Used     Consult Status      Danelle EarthlyRobin Benelli Winther 04/09/2018, 4:32 AM

## 2018-04-10 LAB — RH IG WORKUP (INCLUDES ABO/RH)
ABO/RH(D): O NEG
FETAL SCREEN: NEGATIVE
UNIT DIVISION: 0

## 2018-09-04 ENCOUNTER — Encounter: Payer: Self-pay | Admitting: Physician Assistant

## 2018-10-01 ENCOUNTER — Encounter: Payer: Self-pay | Admitting: Physician Assistant

## 2018-10-01 ENCOUNTER — Ambulatory Visit (INDEPENDENT_AMBULATORY_CARE_PROVIDER_SITE_OTHER): Admitting: Physician Assistant

## 2018-10-01 ENCOUNTER — Other Ambulatory Visit: Payer: Self-pay

## 2018-10-01 VITALS — Ht 64.0 in

## 2018-10-01 DIAGNOSIS — Z130 Encounter for screening for diseases of the blood and blood-forming organs and certain disorders involving the immune mechanism: Secondary | ICD-10-CM | POA: Diagnosis not present

## 2018-10-01 DIAGNOSIS — Z79899 Other long term (current) drug therapy: Secondary | ICD-10-CM | POA: Diagnosis not present

## 2018-10-01 DIAGNOSIS — Z1389 Encounter for screening for other disorder: Secondary | ICD-10-CM

## 2018-10-01 DIAGNOSIS — E559 Vitamin D deficiency, unspecified: Secondary | ICD-10-CM

## 2018-10-01 DIAGNOSIS — Z124 Encounter for screening for malignant neoplasm of cervix: Secondary | ICD-10-CM | POA: Diagnosis not present

## 2018-10-01 DIAGNOSIS — Z13220 Encounter for screening for lipoid disorders: Secondary | ICD-10-CM

## 2018-10-01 DIAGNOSIS — Z1329 Encounter for screening for other suspected endocrine disorder: Secondary | ICD-10-CM | POA: Diagnosis not present

## 2018-10-01 DIAGNOSIS — Z131 Encounter for screening for diabetes mellitus: Secondary | ICD-10-CM | POA: Diagnosis not present

## 2018-10-01 DIAGNOSIS — Z30011 Encounter for initial prescription of contraceptive pills: Secondary | ICD-10-CM

## 2018-10-01 DIAGNOSIS — Z0000 Encounter for general adult medical examination without abnormal findings: Secondary | ICD-10-CM | POA: Diagnosis not present

## 2018-10-01 MED ORDER — NORETHIN ACE-ETH ESTRAD-FE 1-20 MG-MCG PO TABS: 1.0000 | ORAL_TABLET | Freq: Every day | ORAL | 4 refills | Status: AC

## 2018-10-01 NOTE — Progress Notes (Signed)
THIS ENCOUNTER IS A VIRTUAL VISIT DUE TO COVID-19 - PATIENT WAS NOT SEEN IN THE OFFICE.  PATIENT HAS CONSENTED TO VIRTUAL VISIT / TELEMEDICINE VISIT   Virtual Visit via telephone Note  I connected with Cassidy Short on 10/01/2018 by telephone.  I verified that I am speaking with the correct person using two identifiers.    I discussed the limitations of evaluation and management by telemedicine and the availability of in person appointments. The patient expressed understanding and agreed to proceed.  History of Present Illness: She is working, she is s/p 7 months post partum, daughter is evelyn. Denies any depression. She is breast feeding right now, supplementing with formula x 3 months.  She would like to start on a BCP, wants one with estrogen. We discussed this can decrease her milk supply. Discussed Depo shot as an option.  She would like an IUD at some point but states with work, the pandemic and a 30 month old she needs something to help in the mean time.  She is having a menses.  Medications No current outpatient medications on file.  Problem list She has Major depression in full remission (HCC); Allergy; Anxiety; B12 deficiency; Vitamin D deficiency; and Breech birth on their problem list.   Observations/Objective: General Appearance:Well sounding, in no apparent distress.  ENT/Mouth: No hoarseness, No cough for duration of visit.  Respiratory: completing full sentences without distress, without audible wheeze Neuro: Awake and oriented X 3,  Psych:  Insight and Judgment appropriate.   Assessment and Plan: Rut was seen today for follow-up.  Diagnoses and all orders for this visit:  Encounter for BCP (birth control pills) initial prescription -     norethindrone-ethinyl estradiol (MICROGESTIN FE 1/20) 1-20 MG-MCG tablet; Take 1 tablet by mouth daily.   Discussed that his may affect her milk supply She still wants to proceed Will send information about BCP and about depo  shot if she changes her mind.   Follow Up Instructions:    I discussed the assessment and treatment plan with the patient. The patient was provided an opportunity to ask questions and all were answered. The patient agreed with the plan and demonstrated an understanding of the instructions.   The patient was advised to call back or seek an in-person evaluation if the symptoms worsen or if the condition fails to improve as anticipated.  I provided 20 minutes of non-face-to-face time during this encounter.   Quentin Mulling, PA-C

## 2018-10-01 NOTE — Patient Instructions (Signed)
Ethinyl Estradiol; Norethindrone Acetate; Ferrous fumarate chew tabs (contraception) What is this medicine? ETHINYL ESTRADIOL; NORETHINDRONE ACETATE; FERROUS FUMARATE (ETH in il es tra DYE ole; nor eth IN drone AS e tate; FER [place] FUE ma rate) is an oral contraceptive. The products combine two types of female hormones, an estrogen and a progestin. They are used to prevent ovulation and pregnancy. This medicine may be used for other purposes; ask your health care provider or pharmacist if you have questions. COMMON BRAND NAME(S): Melodetta, Mibelas 24 Fe, Minastrin What should I tell my health care provider before I take this medicine? They need to know if you have any of these conditions: -abnormal vaginal bleeding -blood vessel disease or blood clots -breast, cervical, endometrial, ovarian, liver, or uterine cancer -diabetes -gallbladder disease -heart disease or recent heart attack -high blood pressure -high cholesterol -kidney disease -liver disease -migraine headaches -stroke -systemic lupus erythematosus (SLE) -tobacco smoker -an unusual or allergic reaction to estrogens, progestins, other medicines, foods, dyes, or preservatives -pregnant or trying to get pregnant -breast-feeding How should I use this medicine? Take this medicine by mouth. Take tablet whole or chew it completely before swallowing. If you chew this medicine, drink a full glass of water after. Follow the directions on the prescription label. To reduce nausea, this medicine may be taken with food. Take this medicine at the same time each day and in the order directed on the package. Do not take your medicine more often than directed. A patient package insert for the product will be given with each prescription and refill. Read this sheet carefully each time. The sheet may change frequently. Contact your pediatrician regarding the use of this medicine in children. Special care may be needed. This medicine has been used in  female children who have started having menstrual periods. Overdosage: If you think you have taken too much of this medicine contact a poison control center or emergency room at once. NOTE: This medicine is only for you. Do not share this medicine with others. What if I miss a dose? If you miss a dose, refer to the patient information sheet you received with your medicine for direction. If you miss more than one pill, this medicine may not be as effective and you may need to use another form of birth control. What may interact with this medicine? Do not take this medicine with the following medication: -dasabuvir; ombitasvir; paritaprevir; ritonavir -ombitasvir; paritaprevir; ritonavir This medicine may also interact with the following medications: -acetaminophen -antibiotics or medicines for infections, especially rifampin, rifabutin, rifapentine, and griseofulvin, and possibly penicillins or tetracyclines -aprepitant -ascorbic acid (vitamin C) -atorvastatin -barbiturate medicines, such as phenobarbital -bosentan -carbamazepine -caffeine -clofibrate -cyclosporine -dantrolene -doxercalciferol -felbamate -grapefruit juice -hydrocortisone -medicines for anxiety or sleeping problems, such as diazepam or temazepam -medicines for diabetes, including pioglitazone -mineral oil -modafinil -mycophenolate -nefazodone -oxcarbazepine -phenytoin -prednisolone -ritonavir or other medicines for HIV infection or AIDS -rosuvastatin -selegiline -soy isoflavones supplements -St. John's wort -tamoxifen or raloxifene -theophylline -thyroid hormones -topiramate -warfarin This list may not describe all possible interactions. Give your health care provider a list of all the medicines, herbs, non-prescription drugs, or dietary supplements you use. Also tell them if you smoke, drink alcohol, or use illegal drugs. Some items may interact with your medicine. What should I watch for while using this  medicine? Visit your doctor or health care professional for regular checks on your progress. You will need a regular breast and pelvic exam and Pap smear while on this medicine.  Use an additional method of contraception during the first cycle that you take these tablets. If you have any reason to think you are pregnant, stop taking this medicine right away and contact your doctor or health care professional. If you are taking this medicine for hormone related problems, it may take several cycles of use to see improvement in your condition. Smoking increases the risk of getting a blood clot or having a stroke while you are taking birth control pills, especially if you are more than 39 years old. You are strongly advised not to smoke. This medicine can make your body retain fluid, making your fingers, hands, or ankles swell. Your blood pressure can go up. Contact your doctor or health care professional if you feel you are retaining fluid. This medicine can make you more sensitive to the sun. Keep out of the sun. If you cannot avoid being in the sun, wear protective clothing and use sunscreen. Do not use sun lamps or tanning beds/booths. If you wear contact lenses and notice visual changes, or if the lenses begin to feel uncomfortable, consult your eye care specialist. In some women, tenderness, swelling, or minor bleeding of the gums may occur. Notify your dentist if this happens. Brushing and flossing your teeth regularly may help limit this. See your dentist regularly and inform your dentist of the medicines you are taking. If you are going to have elective surgery, you may need to stop taking this medicine before the surgery. Consult your health care professional for advice. This medicine does not protect you against HIV infection (AIDS) or any other sexually transmitted diseases. What side effects may I notice from receiving this medicine? Side effects that you should report to your doctor or health  care professional as soon as possible: -breast tissue changes or discharge -changes in vaginal bleeding during your period or between your periods -chest pain -coughing up blood -dizziness or fainting spells -headaches or migraines -leg, arm or groin pain -severe or sudden headaches -stomach pain (severe) -sudden shortness of breath -sudden loss of coordination, especially on one side of the body -speech problems -symptoms of vaginal infection like itching, irritation or unusual discharge -tenderness in the upper abdomen -vomiting -weakness or numbness in the arms or legs, especially on one side of the body -yellowing of the eyes or skin Side effects that usually do not require medical attention (report to your doctor or health care professional if they continue or are bothersome): -breakthrough bleeding and spotting that continues beyond the 3 initial cycles of pills -breast tenderness -mood changes, anxiety, depression, frustration, anger, or emotional outbursts -increased sensitivity to sun or ultraviolet light -nausea -skin rash, acne, or brown spots on the skin -weight gain (slight) This list may not describe all possible side effects. Call your doctor for medical advice about side effects. You may report side effects to FDA at 1-800-FDA-1088. Where should I keep my medicine? Keep out of the reach of children. Store at room temperature between 15 and 30 degrees C (59 and 86 degrees F). Throw away any unused medicine after the expiration date. NOTE: This sheet is a summary. It may not cover all possible information. If you have questions about this medicine, talk to your doctor, pharmacist, or health care provider.  2019 Elsevier/Gold Standard (2016-01-22 08:04:01)   Contraceptive Injection A contraceptive injection is a shot that prevents pregnancy. It is also called the birth control shot. The shot contains the hormone progestin, which prevents pregnancy by:  Stopping the  ovaries from releasing eggs.  Thickening cervical mucus to prevent sperm from entering the cervix.  Thinning the lining of the uterus to prevent a fertilized egg from attaching to the uterus. Contraceptive injections are given under the skin (subcutaneous) or into a muscle (intramuscular). For these shots to work, you must get one of them every 3 months (12 weeks) from a health care provider. Tell a health care provider about:  Any allergies you have.  All medicines you are taking, including vitamins, herbs, eye drops, creams, and over-the-counter medicines.  Any blood disorders you have.  Any medical conditions you have.  Whether you are pregnant or may be pregnant. What are the risks? Generally, this is a safe procedure. However, problems may occur, including:  Mood changes or depression.  Loss of bone density (osteoporosis) after long-term use.  Blood clots.  Higher risk of an egg being fertilized outside your uterus (ectopic pregnancy).This is rare. What happens before the procedure?  Your health care provider may do a routine physical exam.  You may have a test to make sure you are not pregnant. What happens during the procedure?  The area where the shot will be given will be cleaned and sanitized with alcohol.  A needle will be inserted into a muscle in your upper arm or buttock, or into the skin of your thigh or abdomen. The needle will be attached to a syringe with the medicine inside of it.  The medicine will be pushed through the syringe and injected into your body.  A small bandage (dressing) may be placed over the injection site. What can I expect after the procedure?  After the procedure, it is common to have: ? Soreness around the injection site for a couple of days. ? Irregular menstrual bleeding. ? Weight gain. ? Breast tenderness. ? Headaches. ? Discomfort in your abdomen.  Ask your health care provider whether you need to use an added method of  birth control (backup contraception), such as a condom, sponge, or spermicide. ? If the first shot is given 1-7 days after the start of your last period, you will not need backup contraception. ? If the first shot is given at any other time during your menstrual cycle, you should avoid having sex or you will need backup contraception for 7 days after you receive the shot. Follow these instructions at home: General instructions   Take over-the-counter and prescription medicines only as told by your health care provider.  Do not massage the injection site.  Track your menstrual periods so you will know if they become irregular.  Always use a condom to protect against STIs (sexually transmitted infections).  Make sure you schedule an appointment in time for your next shot, and mark it on your calendar. For the birth control to prevent pregnancy, you must get the injections every 3 months (12 weeks). Lifestyle  Do not use any products that contain nicotine or tobacco, such as cigarettes and e-cigarettes. If you need help quitting, ask your health care provider.  Eat foods that are high in calcium and vitamin D, such as milk, cheese, and salmon. Doing this may help with any loss in bone density that is caused by the contraceptive injection. Ask your health care provider for dietary recommendations. Contact a health care provider if:  You have nausea or vomiting.  You have abnormal vaginal discharge or bleeding.  You miss a period or you think you might be pregnant.  You experience mood changes or depression.  You feel dizzy or light-headed.  You have leg pain. Get help right away if:  You have chest pain.  You cough up blood.  You have shortness of breath.  You have a severe headache that does not go away.  You have numbness in any part of your body.  You have slurred speech.  You have vision problems.  You have vaginal bleeding that is abnormally heavy or does not stop.   You have severe pain in your abdomen.  You have depression that does not get better with treatment. If you ever feel like you may hurt yourself or others, or have thoughts about taking your own life, get help right away. You can go to your nearest emergency department or call:  Your local emergency services (911 in the [place]  A suicide crisis helpline, such as the National Suicide Prevention Lifeline at 930-636-8778. This is open 24 hours a day. Summary  A contraceptive injection is a shot that prevents pregnancy. It is also called the birth control shot.  The shot is given under the skin (subcutaneous) or into a muscle (intramuscular).  After this procedure, it is common to have soreness around the injection site for a couple of days.  To prevent pregnancy, the shot must be given by a health care provider every 3 months (12 weeks).  After you have the shot, ask your health care provider whether you need to use an added method of birth control (backup contraception), such as a condom, sponge, or spermicide. This information is not intended to replace advice given to you by your health care provider. Make sure you discuss any questions you have with your health care provider. Document Released: 01/06/2017 Document Revised: 01/06/2017 Document Reviewed: 01/06/2017 Elsevier Interactive Patient Education  [employer].

## 2018-10-26 ENCOUNTER — Encounter: Payer: Self-pay | Admitting: Internal Medicine

## 2018-11-19 ENCOUNTER — Encounter: Payer: Self-pay | Admitting: Physician Assistant

## 2018-12-01 NOTE — Progress Notes (Signed)
Complete Physical  Assessment and Plan:  Encounter for general adult medical examination with abnormal findings 1 year  Abnormal glucose -     Hemoglobin A1c Discussed disease progression and risks Discussed diet/exercise, weight management and risk modification  B12 deficiency -     Vitamin B12 - get back on supplement  Hypertension screen -     CBC with Differential/Platelet -     COMPLETE METABOLIC PANEL WITH GFR -     TSH -     Urinalysis, Routine w reflex microscopic -     Microalbumin / creatinine urine ratio  Iron deficiency anemia, unspecified iron deficiency anemia type -     Iron,Total/Total Iron Binding Cap  Vitamin D deficiency -     VITAMIN D 25 Hydroxy (Vit-D Deficiency, Fractures)  Medication management -     Magnesium  Pap smear for cervical cancer screening -     PAP this year, never abnormal PAP  Screening cholesterol level -     Lipid panel  Discussed med's effects and SE's. Screening labs and tests as requested with regular follow-up as recommended.  HPI 38 y.o. female  presents for a complete physical. Her blood pressure has been controlled at home, today their BP is BP: 122/86 She does workout. She denies chest pain, shortness of breath, dizziness.   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 Not married, has been dating Jonny RuizJohn have kid together x 03/2018, daughter evelyn, he is fire and rescue. He treats her well. He has 2 older kids.   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   She is not on B12 or vitamin D at this time.  Lab Results  Component Value Date   VITAMINB12 430 10/23/2017    Lab Results  Component Value Date   VD25OH 35 02/27/2018    Current Medications:  Current Outpatient Medications on File Prior to Visit  Medication Sig Dispense Refill  . norethindrone-ethinyl estradiol (MICROGESTIN FE 1/20) 1-20 MG-MCG tablet Take 1 tablet by mouth daily. 3  Package 4   No current facility-administered medications on file prior to visit.    Health Maintenance:   Immunization History  Administered Date(s) Administered  . Influenza-Unspecified 02/26/2014  . Tdap 01/01/2016   Tetanus:  2019 Pneumovax: N/A Flu vaccine: 2015 Zostavax: N/A Pap: 2015, never abnormal pap due this year MGM: N/A DEXA: Colonoscopy: EGD: Menses: Current, regular  Medical History:  Past Medical History:  Diagnosis Date  . Allergy   . Anxiety   . Depression    Allergies No Known Allergies  SURGICAL HISTORY She  has a past surgical history that includes Hernia repair (1986); Tooth extraction (2002); and Cesarean section (N/A, 04/08/2018). FAMILY HISTORY Her family history includes Hypertension in her mother. SOCIAL HISTORY She  reports that she has never smoked. She has never used smokeless tobacco. She reports previous alcohol use. She reports that she does not use drugs.  Review of Systems  Constitutional: Negative.   HENT: Negative.   Eyes: Negative for blurred vision, double vision, photophobia, pain, discharge and redness.  Respiratory: Negative.   Cardiovascular: Negative.   Gastrointestinal: Negative.   Genitourinary: Negative.   Musculoskeletal: Negative.   Skin: Negative.   Neurological: Negative.   Endo/Heme/Allergies: Negative.   Psychiatric/Behavioral: Negative.      Physical Exam: Estimated body mass index is 26.13 kg/m as calculated from the following:   Height  as of this encounter: 5\' 4"  (1.626 m).   Weight as of this encounter: 152 lb 3.2 oz (69 kg). BP 122/86   Pulse 81   Temp 97.9 F (36.6 C)   Ht 5\' 4"  (1.626 m)   Wt 152 lb 3.2 oz (69 kg)   LMP 10/02/2018   SpO2 99%   BMI 26.13 kg/m  General Appearance: Well nourished, in no apparent distress. Eyes: PERRLA, EOMs, right lower inner lid with erythema, swelling, crusting, tenderness to palpation, no surrounding erythema. normal fundi and vessels. Sinuses: No  Frontal/maxillary tenderness ENT/Mouth: Ext aud canals clear, normal light reflex with TMs without erythema, bulging.  Good dentition. No erythema, swelling, or exudate on post pharynx. Tonsils not swollen or erythematous. Hearing normal.  Neck: Supple, thyroid normal. No bruits Respiratory: Respiratory effort normal, BS equal bilaterally without rales, rhonchi, wheezing or stridor. Cardio: RRR without murmurs, rubs or gallops. Brisk peripheral pulses without edema.  Chest: symmetric, with normal excursions and percussion. Breasts: Symmetric, without lumps, nipple discharge, retractions. Abdomen: Soft, +BS. Non tender, no guarding, rebound, hernias, masses, or organomegaly. .  Lymphatics: Non tender without lymphadenopathy.  Genitourinary: defer  Musculoskeletal: Full ROM all peripheral extremities,5/5 strength, and normal gait. Skin: Warm, dry without rashes, lesions, ecchymosis.  Neuro: Cranial nerves intact, reflexes equal bilaterally. Normal muscle tone, no cerebellar symptoms. Sensation intact.  Psych: Awake and oriented X 3, normal affect, Insight and Judgment appropriate.   EKG: defer  Vicie Mutters 3:17 PM

## 2018-12-02 ENCOUNTER — Encounter: Payer: Self-pay | Admitting: Physician Assistant

## 2018-12-02 ENCOUNTER — Other Ambulatory Visit: Payer: Self-pay

## 2018-12-02 ENCOUNTER — Ambulatory Visit: Admitting: Physician Assistant

## 2018-12-02 VITALS — BP 122/86 | HR 81 | Temp 97.9°F | Ht 64.0 in | Wt 152.2 lb

## 2018-12-02 DIAGNOSIS — E538 Deficiency of other specified B group vitamins: Secondary | ICD-10-CM

## 2018-12-02 DIAGNOSIS — Z79899 Other long term (current) drug therapy: Secondary | ICD-10-CM

## 2018-12-02 DIAGNOSIS — Z131 Encounter for screening for diabetes mellitus: Secondary | ICD-10-CM

## 2018-12-02 DIAGNOSIS — E559 Vitamin D deficiency, unspecified: Secondary | ICD-10-CM

## 2018-12-02 DIAGNOSIS — Z1389 Encounter for screening for other disorder: Secondary | ICD-10-CM

## 2018-12-02 DIAGNOSIS — Z0001 Encounter for general adult medical examination with abnormal findings: Secondary | ICD-10-CM

## 2018-12-02 DIAGNOSIS — Z136 Encounter for screening for cardiovascular disorders: Secondary | ICD-10-CM

## 2018-12-02 DIAGNOSIS — R7309 Other abnormal glucose: Secondary | ICD-10-CM

## 2018-12-02 DIAGNOSIS — Z130 Encounter for screening for diseases of the blood and blood-forming organs and certain disorders involving the immune mechanism: Secondary | ICD-10-CM

## 2018-12-02 DIAGNOSIS — Z124 Encounter for screening for malignant neoplasm of cervix: Secondary | ICD-10-CM

## 2018-12-02 DIAGNOSIS — D509 Iron deficiency anemia, unspecified: Secondary | ICD-10-CM

## 2018-12-02 DIAGNOSIS — Z13220 Encounter for screening for lipoid disorders: Secondary | ICD-10-CM

## 2018-12-02 NOTE — Patient Instructions (Signed)
Know what a healthy weight is for you (roughly BMI <25) and aim to maintain this  Aim for 7+ servings of fruits and vegetables daily  65-80+ fluid ounces of water or unsweet tea for healthy kidneys  Limit to max 1 drink of alcohol per day; avoid smoking/tobacco  Limit animal fats in diet for cholesterol and heart health - choose grass fed whenever available  Avoid highly processed foods, and foods high in saturated/trans fats  Aim for low stress - take time to unwind and care for your mental health  Aim for 150 min of moderate intensity exercise weekly for heart health, and weights twice weekly for bone health  Aim for 7-9 hours of sleep daily   Vitamin B12 Deficiency Vitamin B12 deficiency occurs when the body does not have enough vitamin B12, which is an important vitamin. The body needs this vitamin:  To make red blood cells.  To make DNA. This is the genetic material inside cells.  To help the nerves work properly so they can carry messages from the brain to the body. Vitamin B12 deficiency can cause various health problems, such as a low red blood cell count (anemia) or nerve damage. What are the causes? This condition may be caused by:  Not eating enough foods that contain vitamin B12.  Not having enough stomach acid and digestive fluids to properly absorb vitamin B12 from the food that you eat.  Certain digestive system diseases that make it hard to absorb vitamin B12. These diseases include Crohn's disease, chronic pancreatitis, and cystic fibrosis.  A condition in which the body does not make enough of a protein (intrinsic factor), resulting in too few red blood cells (pernicious anemia).  Having a surgery in which part of the stomach or small intestine is removed.  Taking certain medicines that make it hard for the body to absorb vitamin B12. These medicines include: ? Heartburn medicines (antacids and proton pump inhibitors). ? Certain antibiotic medicines. ?  Some medicines that are used to treat diabetes, tuberculosis, gout, or high cholesterol. What increases the risk? The following factors may make you more likely to develop a B12 deficiency:  Being older than age 50.  Eating a vegetarian or vegan diet, especially while you are pregnant.  Eating a poor diet while you are pregnant.  Taking certain medicines.  Having alcoholism. What are the signs or symptoms? In some cases, there are no symptoms of this condition. If the condition leads to anemia or nerve damage, various symptoms can occur, such as:  Weakness.  Fatigue.  Loss of appetite.  Weight loss.  Numbness or tingling in your hands and feet.  Redness and burning of the tongue.  Confusion or memory problems.  Depression.  Sensory problems, such as color blindness, ringing in the ears, or loss of taste.  Diarrhea or constipation.  Trouble walking. If anemia is severe, symptoms can include:  Shortness of breath.  Dizziness.  Rapid heart rate (tachycardia). How is this diagnosed? This condition may be diagnosed with a blood test to measure the level of vitamin B12 in your blood. You may also have other tests, including:  A group of tests that measure certain characteristics of blood cells (complete blood count, CBC).  A blood test to measure intrinsic factor.  A procedure where a thin tube with a camera on the end is used to look into your stomach or intestines (endoscopy). Other tests may be needed to discover the cause of B12 deficiency. How is this  treated? Treatment for this condition depends on the cause. This condition may be treated by:  Changing your eating and drinking habits, such as: ? Eating more foods that contain vitamin B12. ? Drinking less alcohol or no alcohol.  Getting vitamin B12 injections.  Taking vitamin B12 supplements. Your health care provider will tell you which dosage is best for you. Follow these instructions at home: Eating  and drinking   Eat lots of healthy foods that contain vitamin B12, including: ? Meats and poultry. This includes beef, pork, chicken, [place], and organ meats, such as liver. ? Seafood. This includes clams, rainbow trout, salmon, tuna, and haddock. ? Eggs. ? Cereal and dairy products that are fortified. This means that vitamin B12 has been added to the food. Check the label on the package to see if the food is fortified. The items listed above may not be a complete list of recommended foods and beverages. Contact a dietitian for more information. General instructions  Get any injections that are prescribed by your health care provider.  Take supplements only as told by your health care provider. Follow the directions carefully.  Do not drink alcohol if your health care provider tells you not to. In some cases, you may only be asked to limit alcohol use.  Keep all follow-up visits as told by your health care provider. This is important. Contact a health care provider if:  Your symptoms come back. Get help right away if you:  Develop shortness of breath.  Have a rapid heart rate.  Have chest pain.  Become dizzy or lose consciousness. Summary  Vitamin B12 deficiency occurs when the body does not have enough vitamin B12.  The main causes of vitamin B12 deficiency include dietary deficiency, digestive diseases, pernicious anemia, and having a surgery in which part of the stomach or small intestine is removed.  In some cases, there are no symptoms of this condition. If the condition leads to anemia or nerve damage, various symptoms can occur, such as weakness, shortness of breath, and numbness.  Treatment may include getting vitamin B12 injections or taking vitamin B12 supplements. Eat lots of healthy foods that contain vitamin B12. This information is not intended to replace advice given to you by your health care provider. Make sure you discuss any questions you have with your  health care provider. Document Released: 08/05/2011 Document Revised: 01/20/2018 Document Reviewed: 01/20/2018 Elsevier Patient Education  2020 [employer]

## 2018-12-03 LAB — CBC WITH DIFFERENTIAL/PLATELET
Absolute Monocytes: 451 cells/uL (ref 200–950)
Basophils Absolute: 21 cells/uL (ref 0–200)
Basophils Relative: 0.4 %
Eosinophils Absolute: 111 cells/uL (ref 15–500)
Eosinophils Relative: 2.1 %
HCT: 36.8 % (ref 35.0–45.0)
Hemoglobin: 12.5 g/dL (ref 11.7–15.5)
Lymphs Abs: 1892 cells/uL (ref 850–3900)
MCH: 27.2 pg (ref 27.0–33.0)
MCHC: 34 g/dL (ref 32.0–36.0)
MCV: 80.2 fL (ref 80.0–100.0)
MPV: 9.5 fL (ref 7.5–12.5)
Monocytes Relative: 8.5 %
Neutro Abs: 2825 cells/uL (ref 1500–7800)
Neutrophils Relative %: 53.3 %
Platelets: 232 10*3/uL (ref 140–400)
RBC: 4.59 10*6/uL (ref 3.80–5.10)
RDW: 13.2 % (ref 11.0–15.0)
Total Lymphocyte: 35.7 %
WBC: 5.3 10*3/uL (ref 3.8–10.8)

## 2018-12-03 LAB — HEMOGLOBIN A1C
Hgb A1c MFr Bld: 5.4 % of total Hgb (ref <5.7)
Mean Plasma Glucose: 108 (calc)
eAG (mmol/L): 6 (calc)

## 2018-12-03 LAB — LIPID PANEL
Cholesterol: 141 mg/dL (ref <200)
HDL: 59 mg/dL (ref >=50)
LDL Cholesterol (Calc): 63 mg/dL (calc)
Non-HDL Cholesterol (Calc): 82 mg/dL (calc) (ref <130)
Total CHOL/HDL Ratio: 2.4 (calc) (ref <5.0)
Triglycerides: 103 mg/dL (ref <150)

## 2018-12-03 LAB — URINALYSIS, ROUTINE W REFLEX MICROSCOPIC
Bilirubin Urine: NEGATIVE
Glucose, UA: NEGATIVE
Hgb urine dipstick: NEGATIVE
Ketones, ur: NEGATIVE
Leukocytes,Ua: NEGATIVE
Nitrite: NEGATIVE
Protein, ur: NEGATIVE
Specific Gravity, Urine: 1.023 (ref 1.001–1.03)
pH: 5.5 (ref 5.0–8.0)

## 2018-12-03 LAB — COMPLETE METABOLIC PANEL WITH GFR
AG Ratio: 1.8 (calc) (ref 1.0–2.5)
ALT: 11 U/L (ref 6–29)
AST: 14 U/L (ref 10–30)
Albumin: 4.1 g/dL (ref 3.6–5.1)
Alkaline phosphatase (APISO): 40 U/L (ref 31–125)
BUN: 21 mg/dL (ref 7–25)
CO2: 26 mmol/L (ref 20–32)
Calcium: 9.4 mg/dL (ref 8.6–10.2)
Chloride: 104 mmol/L (ref 98–110)
Creat: 0.87 mg/dL (ref 0.50–1.10)
GFR, Est African American: 97 mL/min/{1.73_m2} (ref >=60)
GFR, Est Non African American: 84 mL/min/{1.73_m2} (ref >=60)
Globulin: 2.3 g/dL (calc) (ref 1.9–3.7)
Glucose, Bld: 83 mg/dL (ref 65–99)
Potassium: 3.9 mmol/L (ref 3.5–5.3)
Sodium: 137 mmol/L (ref 135–146)
Total Bilirubin: 0.4 mg/dL (ref 0.2–1.2)
Total Protein: 6.4 g/dL (ref 6.1–8.1)

## 2018-12-03 LAB — IRON, TOTAL/TOTAL IRON BINDING CAP
%SAT: 12 % (calc) — ABNORMAL LOW (ref 16–45)
Iron: 54 ug/dL (ref 40–190)
TIBC: 453 mcg/dL (calc) — ABNORMAL HIGH (ref 250–450)

## 2018-12-03 LAB — PAP, TP IMAGING W/ HPV RNA, RFLX HPV TYPE 16,18/45: HPV DNA High Risk: NOT DETECTED

## 2018-12-03 LAB — VITAMIN D 25 HYDROXY (VIT D DEFICIENCY, FRACTURES): Vit D, 25-Hydroxy: 44 ng/mL (ref 30–100)

## 2018-12-03 LAB — TSH: TSH: 5.35 mIU/L — ABNORMAL HIGH

## 2018-12-03 LAB — MAGNESIUM: Magnesium: 1.8 mg/dL (ref 1.5–2.5)

## 2018-12-03 LAB — MICROALBUMIN / CREATININE URINE RATIO
Creatinine, Urine: 141 mg/dL (ref 20–275)
Microalb Creat Ratio: 4 mcg/mg creat (ref <30)
Microalb, Ur: 0.6 mg/dL

## 2018-12-03 LAB — VITAMIN B12: Vitamin B-12: 437 pg/mL (ref 200–1100)

## 2018-12-03 LAB — PAP, TP IMAGING, WNL RFLX HPV

## 2019-11-10 ENCOUNTER — Encounter: Admitting: Internal Medicine

## 2019-11-25 ENCOUNTER — Encounter: Admitting: Physician Assistant

## 2019-12-05 NOTE — Progress Notes (Deleted)
Complete Physical  Assessment and Plan:  Encounter for general adult medical examination with abnormal findings 1 year  Abnormal glucose -     Hemoglobin A1c Discussed disease progression and risks Discussed diet/exercise, weight management and risk modification  B12 deficiency -     Vitamin B12 - get back on supplement  Hypertension screen -     CBC with Differential/Platelet -     COMPLETE METABOLIC PANEL WITH GFR -     TSH -     Urinalysis, Routine w reflex microscopic -     Microalbumin / creatinine urine ratio  Iron deficiency anemia, unspecified iron deficiency anemia type -     Iron,Total/Total Iron Binding Cap  Vitamin D deficiency -     VITAMIN D 25 Hydroxy (Vit-D Deficiency, Fractures)  Medication management -     Magnesium  Pap smear for cervical cancer screening -     PAP this year, never abnormal PAP  Screening cholesterol level -     Lipid panel  Discussed med's effects and SE's. Screening labs and tests as requested with regular follow-up as recommended.  HPI 39 y.o. female  presents for a complete physical. Her blood pressure has been controlled at home, today their BP is   She does workout. She denies chest pain, shortness of breath, dizziness.   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 Not married, has been dating Jonny Ruiz have kid together x 03/2018, daughter evelyn, he is fire and rescue. He treats her well. He has 2 older kids.   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   She is not on B12 or vitamin D at this time.  Lab Results  Component Value Date   VITAMINB12 437 12/02/2018    Lab Results  Component Value Date   VD25OH 44 12/02/2018    Current Medications:  Current Outpatient Medications on File Prior to Visit  Medication Sig Dispense Refill  . norethindrone-ethinyl estradiol (MICROGESTIN FE 1/20) 1-20 MG-MCG tablet Take 1 tablet by mouth daily. 3 Package  4   No current facility-administered medications on file prior to visit.   Health Maintenance:   Immunization History  Administered Date(s) Administered  . Influenza-Unspecified 02/26/2014, 02/23/2018  . Tdap 01/01/2016, 03/18/2018   Tetanus:  2019 Pneumovax: N/A Flu vaccine: 2015 Zostavax: N/A Pap: 2015, never abnormal pap due this year MGM: N/A DEXA: Colonoscopy: EGD: Menses: Current, regular  Medical History:  Past Medical History:  Diagnosis Date  . Allergy   . Anxiety   . Depression    Allergies No Known Allergies  SURGICAL HISTORY She  has a past surgical history that includes Hernia repair (1986); Tooth extraction (2002); and Cesarean section (N/A, 04/08/2018). FAMILY HISTORY Her family history includes Hypertension in her mother. SOCIAL HISTORY She  reports that she has never smoked. She has never used smokeless tobacco. She reports previous alcohol use. She reports that she does not use drugs.  Review of Systems  Constitutional: Negative.   HENT: Negative.   Eyes: Negative for blurred vision, double vision, photophobia, pain, discharge and redness.  Respiratory: Negative.   Cardiovascular: Negative.   Gastrointestinal: Negative.   Genitourinary: Negative.   Musculoskeletal: Negative.   Skin: Negative.   Neurological: Negative.   Endo/Heme/Allergies: Negative.   Psychiatric/Behavioral: Negative.      Physical Exam: Estimated body mass index is 26.13 kg/m as calculated from the following:  Height as of 12/02/18: 5\' 4"  (1.626 m).   Weight as of 12/02/18: 152 lb 3.2 oz (69 kg). There were no vitals taken for this visit. General Appearance: Well nourished, in no apparent distress. Eyes: PERRLA, EOMs, right lower inner lid with erythema, swelling, crusting, tenderness to palpation, no surrounding erythema. normal fundi and vessels. Sinuses: No Frontal/maxillary tenderness ENT/Mouth: Ext aud canals clear, normal light reflex with TMs without erythema,  bulging.  Good dentition. No erythema, swelling, or exudate on post pharynx. Tonsils not swollen or erythematous. Hearing normal.  Neck: Supple, thyroid normal. No bruits Respiratory: Respiratory effort normal, BS equal bilaterally without rales, rhonchi, wheezing or stridor. Cardio: RRR without murmurs, rubs or gallops. Brisk peripheral pulses without edema.  Chest: symmetric, with normal excursions and percussion. Breasts: Symmetric, without lumps, nipple discharge, retractions. Abdomen: Soft, +BS. Non tender, no guarding, rebound, hernias, masses, or organomegaly. .  Lymphatics: Non tender without lymphadenopathy.  Genitourinary: defer  Musculoskeletal: Full ROM all peripheral extremities,5/5 strength, and normal gait. Skin: Warm, dry without rashes, lesions, ecchymosis.  Neuro: Cranial nerves intact, reflexes equal bilaterally. Normal muscle tone, no cerebellar symptoms. Sensation intact.  Psych: Awake and oriented X 3, normal affect, Insight and Judgment appropriate.   EKG: defer  02/02/19 8:15 PM

## 2019-12-06 ENCOUNTER — Encounter: Admitting: Physician Assistant

## 2020-12-05 ENCOUNTER — Encounter: Payer: Self-pay | Admitting: Physician Assistant

## 2023-08-29 ENCOUNTER — Encounter: Payer: Self-pay | Admitting: Nurse Practitioner

## 2023-10-08 NOTE — Progress Notes (Deleted)
 Chief Complaint: Primary GI MD:  HPI:  *** is a  ***  who was referred to me by Vangie Genet, MD for a complaint of *** .     Discussed the use of AI scribe software for clinical note transcription with the patient, who gave verbal consent to proceed.  History of Present Illness      PREVIOUS GI WORKUP     Past Medical History:  Diagnosis Date   Allergy    Anxiety    Depression     Past Surgical History:  Procedure Laterality Date   CESAREAN SECTION N/A 04/08/2018   Procedure: CESAREAN SECTION;  Surgeon: Meriam Stamp, MD;  Location: [facility];  Service: Obstetrics;  Laterality: N/A;   HERNIA REPAIR  1986   TOOTH EXTRACTION  2002    Current Outpatient Medications  Medication Sig Dispense Refill   norethindrone-ethinyl estradiol  (MICROGESTIN FE 1/20) 1-20 MG-MCG tablet Take 1 tablet by mouth daily. 3 Package 4   No current facility-administered medications for this visit.    Allergies as of 10/09/2023   (No Known Allergies)    Family History  Problem Relation Age of Onset   Hypertension Mother     Social History   Socioeconomic History   Marital status: Married    Spouse name: Not on file   Number of children: Not on file   Years of education: Not on file   Highest education level: Not on file  Occupational History   Not on file  Tobacco Use   Smoking status: Never   Smokeless tobacco: Never  Vaping Use   Vaping status: Not on file  Substance and Sexual Activity   Alcohol use: Not Currently    Comment: seldom   Drug use: No   Sexual activity: Yes    Birth control/protection: None  Other Topics Concern   Not on file  Social History Narrative   Not on file   Social Drivers of Health   Financial Resource Strain: Not on file  Food Insecurity: Not on file  Transportation Needs: Not on file  Physical Activity: Not on file  Stress: Not on file  Social Connections: Not on file  Intimate Partner Violence: Not on file     Review of Systems:    Constitutional: No weight loss, fever, chills, weakness or fatigue HEENT: Eyes: No change in vision               Ears, Nose, Throat:  No change in hearing or congestion Skin: No rash or itching Cardiovascular: No chest pain, chest pressure or palpitations   Respiratory: No SOB or cough Gastrointestinal: See HPI and otherwise negative Genitourinary: No dysuria or change in urinary frequency Neurological: No headache, dizziness or syncope Musculoskeletal: No new muscle or joint pain Hematologic: No bleeding or bruising Psychiatric: No history of depression or anxiety    Physical Exam:  Vital signs: There were no vitals taken for this visit.  Constitutional: NAD, alert and cooperative Head:  Normocephalic and atraumatic. Eyes:   PEERL, EOMI. No icterus. Conjunctiva pink. Respiratory: Respirations even and unlabored. Lungs clear to auscultation bilaterally.   No wheezes, crackles, or rhonchi.  Cardiovascular:  Regular rate and rhythm. No peripheral edema, cyanosis or pallor.  Gastrointestinal:  Soft, nondistended, nontender. No rebound or guarding. Normal bowel sounds. No appreciable masses or hepatomegaly. Rectal:  Declines Msk:  Symmetrical without gross deformities. Without edema, no deformity or joint abnormality.  Neurologic:  Alert and  oriented x4;  grossly  normal neurologically.  Skin:   Dry and intact without significant lesions or rashes. Psychiatric: Oriented to person, place and time. Demonstrates good judgement and reason without abnormal affect or behaviors.  Physical Exam    RELEVANT LABS AND IMAGING: CBC    Component Value Date/Time   WBC 5.3 12/02/2018 1540   RBC 4.59 12/02/2018 1540   HGB 12.5 12/02/2018 1540   HCT 36.8 12/02/2018 1540   PLT 232 12/02/2018 1540   MCV 80.2 12/02/2018 1540   MCH 27.2 12/02/2018 1540   MCHC 34.0 12/02/2018 1540   RDW 13.2 12/02/2018 1540   LYMPHSABS 1,892 12/02/2018 1540   MONOABS 315 01/01/2016  0940   EOSABS 111 12/02/2018 1540   BASOSABS 21 12/02/2018 1540    CMP     Component Value Date/Time   NA 137 12/02/2018 1540   K 3.9 12/02/2018 1540   CL 104 12/02/2018 1540   CO2 26 12/02/2018 1540   GLUCOSE 83 12/02/2018 1540   BUN 21 12/02/2018 1540   CREATININE 0.87 12/02/2018 1540   CALCIUM 9.4 12/02/2018 1540   PROT 6.4 12/02/2018 1540   ALBUMIN 4.1 01/01/2016 0940   AST 14 12/02/2018 1540   ALT 11 12/02/2018 1540   ALKPHOS 37 01/01/2016 0940   BILITOT 0.4 12/02/2018 1540   GFRNONAA 84 12/02/2018 1540   GFRAA 97 12/02/2018 1540     Assessment/Plan:   Assessment and Plan Assessment & Plan        Gigi Kyle [facility] 10/08/2023, 11:48 AM  Cc: Vangie Genet, MD

## 2023-10-09 ENCOUNTER — Ambulatory Visit: Admitting: Gastroenterology

## 2023-10-29 ENCOUNTER — Ambulatory Visit: Admitting: Nurse Practitioner
